# Patient Record
Sex: Female | Born: 1970 | Race: Black or African American | Hispanic: No | Marital: Single | Smoking: Never smoker
Health system: Southern US, Community
[De-identification: ages and names within clinical notes are randomized; demographics above are authoritative.]

---

## 2001-02-28 ENCOUNTER — Emergency Department (HOSPITAL_COMMUNITY): Admission: EM | Admit: 2001-02-28 | Discharge: 2001-02-28 | Payer: Self-pay | Admitting: Emergency Medicine

## 2001-07-05 ENCOUNTER — Encounter: Payer: Self-pay | Admitting: Emergency Medicine

## 2001-07-05 ENCOUNTER — Emergency Department (HOSPITAL_COMMUNITY): Admission: EM | Admit: 2001-07-05 | Discharge: 2001-07-05 | Payer: Self-pay

## 2001-07-07 ENCOUNTER — Inpatient Hospital Stay (HOSPITAL_COMMUNITY): Admission: AD | Admit: 2001-07-07 | Discharge: 2001-07-07 | Payer: Self-pay | Admitting: Obstetrics

## 2001-07-13 ENCOUNTER — Encounter: Payer: Self-pay | Admitting: Obstetrics

## 2001-07-13 ENCOUNTER — Inpatient Hospital Stay (HOSPITAL_COMMUNITY): Admission: RE | Admit: 2001-07-13 | Discharge: 2001-07-13 | Payer: Self-pay | Admitting: Obstetrics

## 2001-08-02 ENCOUNTER — Inpatient Hospital Stay (HOSPITAL_COMMUNITY): Admission: AD | Admit: 2001-08-02 | Discharge: 2001-08-02 | Payer: Self-pay | Admitting: *Deleted

## 2001-10-28 ENCOUNTER — Encounter: Payer: Self-pay | Admitting: Obstetrics and Gynecology

## 2001-10-28 ENCOUNTER — Inpatient Hospital Stay (HOSPITAL_COMMUNITY): Admission: AD | Admit: 2001-10-28 | Discharge: 2001-10-28 | Payer: Self-pay | Admitting: Obstetrics and Gynecology

## 2001-11-29 ENCOUNTER — Inpatient Hospital Stay (HOSPITAL_COMMUNITY): Admission: AD | Admit: 2001-11-29 | Discharge: 2001-11-29 | Payer: Self-pay | Admitting: *Deleted

## 2001-11-29 ENCOUNTER — Encounter (INDEPENDENT_AMBULATORY_CARE_PROVIDER_SITE_OTHER): Payer: Self-pay

## 2001-12-06 ENCOUNTER — Inpatient Hospital Stay (HOSPITAL_COMMUNITY): Admission: AD | Admit: 2001-12-06 | Discharge: 2001-12-06 | Payer: Self-pay | Admitting: *Deleted

## 2002-01-08 ENCOUNTER — Inpatient Hospital Stay (HOSPITAL_COMMUNITY): Admission: AD | Admit: 2002-01-08 | Discharge: 2002-01-08 | Payer: Self-pay | Admitting: *Deleted

## 2002-01-22 ENCOUNTER — Inpatient Hospital Stay (HOSPITAL_COMMUNITY): Admission: AD | Admit: 2002-01-22 | Discharge: 2002-01-22 | Payer: Self-pay | Admitting: *Deleted

## 2002-01-26 ENCOUNTER — Inpatient Hospital Stay (HOSPITAL_COMMUNITY): Admission: AD | Admit: 2002-01-26 | Discharge: 2002-01-26 | Payer: Self-pay | Admitting: *Deleted

## 2002-02-21 ENCOUNTER — Inpatient Hospital Stay (HOSPITAL_COMMUNITY): Admission: AD | Admit: 2002-02-21 | Discharge: 2002-02-21 | Payer: Self-pay | Admitting: *Deleted

## 2002-02-24 ENCOUNTER — Inpatient Hospital Stay (HOSPITAL_COMMUNITY): Admission: AD | Admit: 2002-02-24 | Discharge: 2002-02-24 | Payer: Self-pay | Admitting: *Deleted

## 2002-02-28 ENCOUNTER — Inpatient Hospital Stay (HOSPITAL_COMMUNITY): Admission: AD | Admit: 2002-02-28 | Discharge: 2002-02-28 | Payer: Self-pay | Admitting: *Deleted

## 2002-03-05 ENCOUNTER — Inpatient Hospital Stay (HOSPITAL_COMMUNITY): Admission: AD | Admit: 2002-03-05 | Discharge: 2002-03-05 | Payer: Self-pay | Admitting: *Deleted

## 2002-03-07 ENCOUNTER — Inpatient Hospital Stay (HOSPITAL_COMMUNITY): Admission: AD | Admit: 2002-03-07 | Discharge: 2002-03-10 | Payer: Self-pay | Admitting: *Deleted

## 2002-12-29 ENCOUNTER — Other Ambulatory Visit: Admission: RE | Admit: 2002-12-29 | Discharge: 2002-12-29 | Payer: Self-pay | Admitting: *Deleted

## 2003-04-10 ENCOUNTER — Ambulatory Visit (HOSPITAL_COMMUNITY): Admission: RE | Admit: 2003-04-10 | Discharge: 2003-04-10 | Payer: Self-pay | Admitting: *Deleted

## 2003-04-10 ENCOUNTER — Encounter: Payer: Self-pay | Admitting: *Deleted

## 2003-04-24 ENCOUNTER — Inpatient Hospital Stay (HOSPITAL_COMMUNITY): Admission: AD | Admit: 2003-04-24 | Discharge: 2003-04-25 | Payer: Self-pay | Admitting: *Deleted

## 2003-06-06 ENCOUNTER — Inpatient Hospital Stay (HOSPITAL_COMMUNITY): Admission: AD | Admit: 2003-06-06 | Discharge: 2003-06-07 | Payer: Self-pay | Admitting: *Deleted

## 2003-07-03 ENCOUNTER — Inpatient Hospital Stay (HOSPITAL_COMMUNITY): Admission: AD | Admit: 2003-07-03 | Discharge: 2003-07-04 | Payer: Self-pay | Admitting: Obstetrics and Gynecology

## 2003-07-17 ENCOUNTER — Inpatient Hospital Stay (HOSPITAL_COMMUNITY): Admission: AD | Admit: 2003-07-17 | Discharge: 2003-07-19 | Payer: Self-pay | Admitting: Obstetrics and Gynecology

## 2003-08-20 ENCOUNTER — Other Ambulatory Visit: Admission: RE | Admit: 2003-08-20 | Discharge: 2003-08-20 | Payer: Self-pay | Admitting: Obstetrics and Gynecology

## 2003-09-25 ENCOUNTER — Ambulatory Visit (HOSPITAL_COMMUNITY): Admission: RE | Admit: 2003-09-25 | Discharge: 2003-09-25 | Payer: Self-pay | Admitting: Obstetrics and Gynecology

## 2003-09-25 ENCOUNTER — Encounter (INDEPENDENT_AMBULATORY_CARE_PROVIDER_SITE_OTHER): Payer: Self-pay | Admitting: Specialist

## 2003-12-28 ENCOUNTER — Other Ambulatory Visit: Admission: RE | Admit: 2003-12-28 | Discharge: 2003-12-28 | Payer: Self-pay | Admitting: Obstetrics and Gynecology

## 2005-03-16 ENCOUNTER — Other Ambulatory Visit: Admission: RE | Admit: 2005-03-16 | Discharge: 2005-03-16 | Payer: Self-pay | Admitting: Obstetrics and Gynecology

## 2005-04-20 ENCOUNTER — Ambulatory Visit: Payer: Self-pay | Admitting: Family Medicine

## 2005-06-03 ENCOUNTER — Ambulatory Visit: Payer: Self-pay | Admitting: Family Medicine

## 2005-11-27 ENCOUNTER — Ambulatory Visit: Payer: Self-pay | Admitting: Family Medicine

## 2009-11-27 ENCOUNTER — Encounter: Payer: Self-pay | Admitting: Internal Medicine

## 2010-03-20 ENCOUNTER — Encounter: Payer: Self-pay | Admitting: Internal Medicine

## 2010-04-15 ENCOUNTER — Encounter: Payer: Self-pay | Admitting: Emergency Medicine

## 2010-04-16 DIAGNOSIS — J189 Pneumonia, unspecified organism: Secondary | ICD-10-CM

## 2010-04-17 ENCOUNTER — Encounter (INDEPENDENT_AMBULATORY_CARE_PROVIDER_SITE_OTHER): Payer: Self-pay | Admitting: *Deleted

## 2010-04-17 ENCOUNTER — Ambulatory Visit: Payer: Self-pay | Admitting: Internal Medicine

## 2010-04-17 LAB — CONVERTED CEMR LAB
CD4 T Helper %: 1 %
HIV 1 RNA Quant: 654000 copies/mL

## 2010-04-21 ENCOUNTER — Ambulatory Visit: Payer: Self-pay | Admitting: Vascular Surgery

## 2010-04-21 ENCOUNTER — Encounter: Payer: Self-pay | Admitting: Infectious Diseases

## 2010-04-24 ENCOUNTER — Encounter (INDEPENDENT_AMBULATORY_CARE_PROVIDER_SITE_OTHER): Payer: Self-pay | Admitting: *Deleted

## 2010-04-24 LAB — CONVERTED CEMR LAB
Glucose, Bld: 105 mg/dL
Hemoglobin: 1102 g/dL

## 2010-04-25 DIAGNOSIS — D638 Anemia in other chronic diseases classified elsewhere: Secondary | ICD-10-CM | POA: Insufficient documentation

## 2010-04-29 ENCOUNTER — Encounter (INDEPENDENT_AMBULATORY_CARE_PROVIDER_SITE_OTHER): Payer: Self-pay | Admitting: *Deleted

## 2010-05-01 ENCOUNTER — Ambulatory Visit: Payer: Self-pay | Admitting: Internal Medicine

## 2010-05-01 DIAGNOSIS — Z888 Allergy status to other drugs, medicaments and biological substances status: Secondary | ICD-10-CM

## 2010-05-02 ENCOUNTER — Ambulatory Visit: Payer: Self-pay | Admitting: Internal Medicine

## 2010-05-02 ENCOUNTER — Inpatient Hospital Stay (HOSPITAL_COMMUNITY): Admission: EM | Admit: 2010-05-02 | Discharge: 2010-05-10 | Payer: Self-pay | Admitting: Emergency Medicine

## 2010-05-06 DIAGNOSIS — B20 Human immunodeficiency virus [HIV] disease: Secondary | ICD-10-CM

## 2010-05-06 DIAGNOSIS — A318 Other mycobacterial infections: Secondary | ICD-10-CM | POA: Insufficient documentation

## 2010-05-06 DIAGNOSIS — F29 Unspecified psychosis not due to a substance or known physiological condition: Secondary | ICD-10-CM | POA: Insufficient documentation

## 2010-05-14 ENCOUNTER — Encounter: Payer: Self-pay | Admitting: Internal Medicine

## 2010-05-21 ENCOUNTER — Ambulatory Visit: Payer: Self-pay | Admitting: Internal Medicine

## 2010-05-22 ENCOUNTER — Encounter (INDEPENDENT_AMBULATORY_CARE_PROVIDER_SITE_OTHER): Payer: Self-pay | Admitting: *Deleted

## 2010-05-22 ENCOUNTER — Telehealth (INDEPENDENT_AMBULATORY_CARE_PROVIDER_SITE_OTHER): Payer: Self-pay | Admitting: *Deleted

## 2010-05-27 ENCOUNTER — Ambulatory Visit: Payer: Self-pay | Admitting: Internal Medicine

## 2010-05-27 LAB — CONVERTED CEMR LAB
ALT: 19 units/L (ref 0–35)
AST: 29 units/L (ref 0–37)
Alkaline Phosphatase: 120 units/L — ABNORMAL HIGH (ref 39–117)
Basophils Relative: 3 % — ABNORMAL HIGH (ref 0–1)
Calcium: 9.2 mg/dL (ref 8.4–10.5)
Chlamydia, Swab/Urine, PCR: NEGATIVE
Chloride: 101 meq/L (ref 96–112)
Creatinine, Ser: 0.68 mg/dL (ref 0.40–1.20)
GC Probe Amp, Urine: NEGATIVE
HCT: 27.4 % — ABNORMAL LOW (ref 36.0–46.0)
Hep B S Ab: POSITIVE — AB
Hepatitis B Surface Ag: NEGATIVE
Lymphs Abs: 0.7 10*3/uL (ref 0.7–4.0)
Neutro Abs: 0.6 10*3/uL — ABNORMAL LOW (ref 1.7–7.7)
Neutrophils Relative %: 25 % — ABNORMAL LOW (ref 43–77)
Platelets: 262 10*3/uL (ref 150–400)
Potassium: 3.5 meq/L (ref 3.5–5.3)
RBC: 3.12 M/uL — ABNORMAL LOW (ref 3.87–5.11)
Total CHOL/HDL Ratio: 7.8
Total Protein: 7.4 g/dL (ref 6.0–8.3)
VLDL: 32 mg/dL (ref 0–40)
WBC: 2.3 10*3/uL — ABNORMAL LOW (ref 4.0–10.5)

## 2010-05-29 ENCOUNTER — Encounter (INDEPENDENT_AMBULATORY_CARE_PROVIDER_SITE_OTHER): Payer: Self-pay | Admitting: *Deleted

## 2010-06-02 ENCOUNTER — Telehealth: Payer: Self-pay | Admitting: Internal Medicine

## 2010-07-18 ENCOUNTER — Ambulatory Visit: Payer: Self-pay | Admitting: Psychiatry

## 2010-08-05 ENCOUNTER — Ambulatory Visit: Payer: Self-pay | Admitting: Internal Medicine

## 2010-08-05 LAB — CONVERTED CEMR LAB
ALT: 11 units/L (ref 0–35)
Alkaline Phosphatase: 93 units/L (ref 39–117)
Chloride: 106 meq/L (ref 96–112)
Creatinine, Ser: 0.78 mg/dL (ref 0.40–1.20)
Eosinophils Absolute: 0.3 10*3/uL (ref 0.0–0.7)
Eosinophils Relative: 13 % — ABNORMAL HIGH (ref 0–5)
Glucose, Bld: 72 mg/dL (ref 70–99)
HIV-1 RNA Quant, Log: <1.3 (ref <1.30)
Lymphocytes Relative: 25 % (ref 12–46)
Lymphs Abs: 0.6 10*3/uL — ABNORMAL LOW (ref 0.7–4.0)
MCV: 92 fL (ref 78.0–100.0)
Monocytes Absolute: 0.5 10*3/uL (ref 0.1–1.0)
Neutrophils Relative %: 38 % — ABNORMAL LOW (ref 43–77)
Platelets: 252 10*3/uL (ref 150–400)
Potassium: 4 meq/L (ref 3.5–5.3)
RDW: 15.9 % — ABNORMAL HIGH (ref 11.5–15.5)
Total Bilirubin: 0.8 mg/dL (ref 0.3–1.2)

## 2010-08-12 ENCOUNTER — Telehealth (INDEPENDENT_AMBULATORY_CARE_PROVIDER_SITE_OTHER): Payer: Self-pay | Admitting: *Deleted

## 2010-08-14 ENCOUNTER — Inpatient Hospital Stay (HOSPITAL_COMMUNITY): Admission: EM | Admit: 2010-08-14 | Discharge: 2010-04-25 | Payer: Self-pay | Admitting: Internal Medicine

## 2010-09-16 ENCOUNTER — Encounter: Payer: Self-pay | Admitting: Internal Medicine

## 2010-09-16 ENCOUNTER — Ambulatory Visit
Admission: RE | Admit: 2010-09-16 | Discharge: 2010-09-16 | Payer: Self-pay | Source: Home / Self Care | Attending: Internal Medicine | Admitting: Internal Medicine

## 2010-10-03 ENCOUNTER — Ambulatory Visit: Admit: 2010-10-03 | Payer: Self-pay | Admitting: Internal Medicine

## 2010-10-06 ENCOUNTER — Encounter (INDEPENDENT_AMBULATORY_CARE_PROVIDER_SITE_OTHER): Payer: Self-pay | Admitting: *Deleted

## 2010-10-09 NOTE — Assessment & Plan Note (Signed)
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  History of Present Illness: Michelle Flores is in for her hospital follow-up visit with her god mother, Mrs. Lorin Picket. She says that she is feeling much better.  She does not have her medications or her pill box with her says that she has not missed any of her medications.  She has been staying with Mrs. Scott since discharge.  Preventive Screening-Counseling & Management  Alcohol-Tobacco     Alcohol drinks/day: 0     Smoking Status: never  Caffeine-Diet-Exercise     Caffeine use/day: no     Does Patient Exercise: yes     Type of exercise: yoga     Exercise (avg: min/session): >60     Times/week: 2  Hep-HIV-STD-Contraception     HIV Risk: no risk noted  Safety-Violence-Falls     Seat Belt Use: yes  Comments: declined condoms      Sexual History:  currently monogamous.        Drug Use:  never.     Prior Medication List:  TRUVADA 200-300 MG TABS (EMTRICITABINE-TENOFOVIR) Take 1 tablet by mouth once a day ISENTRESS 400 MG TABS (RALTEGRAVIR POTASSIUM) Take 1 tablet by mouth two times a day FLUCONAZOLE 100 MG TABS (FLUCONAZOLE) Take one tablet by mouth on Fridays DAPSONE 100 MG TABS (DAPSONE) Take 1 tablet by mouth once a day AZITHROMYCIN 600 MG TABS (AZITHROMYCIN) Take 2 tablets by mouth on Thursdays. NYSTATIN 100000 UNIT/ML SUSP (NYSTATIN) Take 2 mL by mouth every 6 hours as needed NONI MORINDA CITRIFOLIA 400 MG CAPS (NONI (MORINDA CITRIFOLIA)) Take 1 tablet by mouth two times a day PROMETHAZINE HCL 6.25 MG/5ML SYRP (PROMETHAZINE HCL) Take 1 teaspoon by mouth every 6 hours as needed for nausea. FERROUS SULFATE 325 (65 FE) MG TABS (FERROUS SULFATE) Take 1 tablet by mouth two times a day   Current Allergies: ! SULFA ! BENADRYL ! 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 No Pain Assessment Patient in pain? no      Nutritional Status BMI of 19 -24 = normal Nutritional Status Detail appetite "good"  Have you ever been in a relationship where you felt threatened, hurt or afraid?not asked, god mother present   Does patient need assistance? Functional Status Self care Ambulation Normal Comments no missed doses of rxes since leaving the hosp. LMP (date): 03/28/2010     Menstrual Status regular Enter LMP: 03/28/2010   Physical Exam  General:  alert and well-nourished.   Mouth:  good dentition, pharynx pink and moist, no erythema, and no exudates.   Lungs:  normal breath sounds, no crackles, and no wheezes.  Heart:  normal rate, regular rhythm, and no murmur.   Skin:  no rashes.   Axillary Nodes:  no R axillary adenopathy and no L axillary adenopathy.   Psych:  good eye contact, not anxious appearing, and not depressed appearing.  She continues to talk about returning to [place] then returning here to start her [employer].        Medication Adherence: 05/01/2010   Adherence to medications reviewed with patient. Counseling to provide adequate adherence provided                                Impression & Recommendations:  Problem # 1:  HIV INFECTION (ICD-042) Alyse has not been able to adhere to any regimen for her HIV infection in the past.  She was counseled extensively by her previous  doctors at [facility] without success.  Although she tells me she is taking her medications I still have my doubts.  She is unwilling to enter counseling at this time.  I will see her back in one week and I have asked her and Mrs. Scott to return with all of her medications. The following medications were removed from the medication list:    Azithromycin 600 Mg Tabs (Azithromycin) .Marland Kitchen... Take 2 tablets by mouth on thursdays. Her updated medication list for this problem includes:    Fluconazole 100 Mg Tabs (Fluconazole) .Marland Kitchen... Take one tablet by mouth on fridays    Nystatin 100000 Unit/ml Susp (Nystatin) .Marland Kitchen... 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 Ideally, she should start on a 3 drug regimen but currently she is asymptomatic and I feel quite certain that additional medications at this time it would probably overwhelm her.  I will see her back in one week.  Other Orders: Influenza Vaccine NON MCR (04540) Est. Patient Level IV (98119)  Patient Instructions: 1)  Please schedule a follow-up appointment on Wednesday, 8/31 at 9:00 am.    Pneumovax Immunization History:    Pneumovax # 1:  Historical (02/17/2008)  Hepatitis A Immunization History:    Hep A # 1:  Historical (02/17/2008)  Influenza Vaccine    Vaccine Type: Fluvax Non-MCR    Site: left deltoid    Mfr: novartis    Dose: 0.5 ml    Route: IM    Given by: Jennet Maduro RN    Exp. Date: 12/07/2010    Lot #: [id number]  Flu Vaccine Consent Questions    Do you have a history of severe allergic reactions to this vaccine? no    Any prior history of allergic reactions to egg and/or gelatin? no    Do  you have a sensitivity to the preservative Thimersol? no    Do you have a past history of Guillan-Barre Syndrome? no    Do you currently have an acute febrile illness? no    Have you ever had a severe reaction to latex? no    Vaccine information given and explained to patient? yes    Are you currently pregnant? no

## 2010-10-09 NOTE — Assessment & Plan Note (Signed)
Summary: 6wk f/u [mkj]   CC:  follow-up visit.  History of Present Illness: Michelle Flores is in for a routine visit.  She states that she is doing very well with the exception of being concerned about her weight gain and a mild rash on her face.  She is not had any problems obtaining her medications and states that she has not missed a single dose since her last visit.  She is working with her law here to regain custody of her two daughters and has moved them back here to [place].  They are enrolled in a new school and she is considering returning to work.  Preventive Screening-Counseling & Management  Alcohol-Tobacco     Alcohol drinks/day: 0     Smoking Status: never  Caffeine-Diet-Exercise     Caffeine use/day: no     Does Patient Exercise: yes     Type of exercise: yoga     Exercise (avg: min/session): >60     Times/week: 2  Hep-HIV-STD-Contraception     HIV Risk: no risk noted     HIV Risk Counseling: not indicated-no HIV risk noted  Safety-Violence-Falls     Seat Belt Use: yes  Comments: declined condoms      Sexual History:  currently monogamous.        Drug Use:  never.     Current Allergies (reviewed today): ! SULFA ! BENADRYL ! * LATEX Vital Signs:  Patient profile:   40 year old female Menstrual status:  irregular Height:      66 inches (167.64 cm) Weight:      152.25 pounds (69.20 kg) BMI:     24.66 Temp:     98.2 degrees F (36.78 degrees C) oral Pulse rate:   81 / minute BP sitting:   113 / 70  (right arm) Cuff size:   regular  Vitals Entered By: Jennet Maduro RN (September 16, 2010 10:50 AM) CC: follow-up visit Is Patient Diabetic? No Pain Assessment Patient in pain? no      Nutritional Status BMI of 19 -24 = normal Nutritional Status Detail appetite "good"  Have you ever been in a relationship where you felt threatened, hurt or afraid?No   Does patient need assistance? Functional Status Self care Ambulation Normal Comments no missed doses  of rxes   Physical Exam  General:  alert and well-nourished.  she has gained weight. Mouth:  good dentition, pharynx pink and moist, no erythema, and no exudates.   Lungs:  normal breath sounds, no crackles, and no wheezes.   Heart:  normal rate, regular rhythm, and no murmur.   Skin:  there is a very fine a nonspecific papular rash on her cheeks. Psych:  normally interactive, good eye contact, not anxious appearing, and not depressed appearing.          Medication Adherence: 09/16/2010   Adherence to medications reviewed with patient. Counseling to provide adequate adherence provided   Prevention For Positives: 09/16/2010   Safe sex practices discussed with patient. Condoms offered.                             Impression & Recommendations:  Problem # 1:  HIV INFECTION (ICD-042) Michelle Flores has had remarkable improvement with complete suppression of her virus.  Her weight gain and mild rash of the results of the recovering immune system.  I will continue her current regimen. Her updated medication list for this problem includes:  Fluconazole 100 Mg Tabs (Fluconazole) .Marland Kitchen... Take one tablet by mouth on fridays    Azithromycin 500 Mg Tabs (Azithromycin) .Marland Kitchen... Take 1 tablet by mouth once a day    Ethambutol Hcl 400 Mg Tabs (Ethambutol hcl) .Marland Kitchen... 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 Patient Level III (99213)Future Orders: T-CD4SP ([facility]) (CD4SP) ... 12/15/2010 T-HIV Viral Load [id number]) ... 12/15/2010 T-Comprehensive Metabolic Panel [id number]) ... 12/15/2010 T-CBC w/Diff ([id number]) ... 12/15/2010 T-RPR (Syphilis) [id number]) ... 12/15/2010 T-Lipid Profile [id number]) ...  12/15/2010  Medications Added to Medication List This Visit: 1)  Mega Food Vitamin Supplement  .... Take 1 capsule by mouth once a day  Patient Instructions: 1)  Please schedule a follow-up appointment in 3 months. 2)  Please schedule an appointment fo the Pap clinic.    PPD Skin Test (to be given today)  Appended Document: 6wk f/u + PPD   PPD Application    Vaccine Type: PPD    Site: left forearm    Mfr: Sanofi Pasteur    Dose: 0.1 ml    Route: ID    Given by: Jennet Maduro RN    Exp. Date: 03/13/2012    Lot #: [id number]

## 2010-10-09 NOTE — Consult Note (Signed)
Summary: Alesia Banda ID  [facility] ID   Imported By: Florinda Marker 05/14/2010 11:56:23  _____________________________________________________________________  External Attachment:    Type:   Image     Comment:   External Document

## 2010-10-09 NOTE — Assessment & Plan Note (Signed)
Summary: HFU/VS   CC:  follow-up visit and O2 sat while walking dropped to 85% w/ HR of 124.  History of Present Illness: Michelle Flores is in for her hospital follow-up visit after her two recent hospitalizations with probable PCP and Mycobacterium avium bacteremia.  Since discharge she has been living with her sister and children in [place].  She took a train impact appeared today and plans to return by train tonight.  She says she is feeling better and has minimal shortness of breath.  She is unaware of any recent fevers.  She does not have her medications or pill box with her.  She says that she has been using her pill box.  She tells me that she has been taking her Truvada, Isentress, fluconazole, primaquine, prednisone and iron.   She tells me that when she went to the [facility] pharmacy on [address] in [place] after her last discharge, the pharmacist told her that clindamycin, ethambutol, and clarithromycin were no longer available.  She has not been taking any of those medications since discharge.  She says that she is planning to return to [place] on September 22 to get some rest.  She hopes to stay there at least one month.  Preventive Screening-Counseling & Management  Alcohol-Tobacco     Alcohol drinks/day: 0     Smoking Status: never  Caffeine-Diet-Exercise     Caffeine use/day: no     Does Patient Exercise: yes     Type of exercise: yoga     Exercise (avg: min/session): >60     Times/week: 2  Hep-HIV-STD-Contraception     HIV Risk: no risk noted  Safety-Violence-Falls     Seat Belt Use: yes      Sexual History:  engaged.        Drug Use:  never.     Updated Prior Medication List: TRUVADA 200-300 MG TABS (EMTRICITABINE-TENOFOVIR) Take 1 tablet by mouth once a day ISENTRESS 400 MG TABS (RALTEGRAVIR POTASSIUM) Take 1 tablet by mouth two times a day PRIMAQUINE PHOSPHATE 26.3 MG TABS (PRIMAQUINE PHOSPHATE)  PREDNISONE 10 MG TABS (PREDNISONE)  FLUCONAZOLE 100 MG TABS  (FLUCONAZOLE) Take one tablet by mouth on Fridays NONI MORINDA CITRIFOLIA 400 MG CAPS (NONI (MORINDA CITRIFOLIA)) Take 1 tablet by mouth two times a day PROMETHAZINE HCL 6.25 MG/5ML SYRP (PROMETHAZINE HCL) Take 1 teaspoon by mouth every 6 hours as needed for nausea. FERROUS SULFATE 325 (65 FE) MG TABS (FERROUS SULFATE) Take 1 tablet by mouth two times a day  Current Allergies (reviewed today): ! SULFA ! BENADRYL ! * LATEX Social History: Sexual History:  engaged  Vital Signs:  Patient profile:   40 year old female Menstrual status:  regular Height:      66 inches (167.64 cm) Weight:      130.5 pounds (59.32 kg) BMI:     21.14 O2 Sat:      92 % on Room air Temp:     99.2 degrees F (37.33 degrees C) oral Pulse rate:   114 / minute BP sitting:   105 / 67  (right arm) Cuff size:   regular  Vitals Entered By: Jennet Maduro RN (May 21, 2010 4:04 PM)  O2 Flow:  Room air  Serial Vital Signs/Assessments:                                PEF    PreRx  PostRx Time  O2 Sat  O2 Type     L/min  L/min  L/min   By 4:32 PM   85  %   Room air                          Jennet Maduro RN  Comments: 4:32 PM O2 sat dropped to 85% while walking, pulse up to 124 By: Jennet Maduro RN   CC: follow-up visit, O2 sat while walking dropped to 85% w/ HR of 124 Is Patient Diabetic? No Pain Assessment Patient in pain? no      Nutritional Status BMI of 19 -24 = normal Nutritional Status Detail appetite "very good"  Have you ever been in a relationship where you felt threatened, hurt or afraid?No   Does patient need assistance? Functional Status Self care Ambulation Normal Comments clindamycin and other ABX from hospital, unable to obtain refills due to unable to reach Hospitalist.  No missed doses of HIV rxes.        Medication Adherence: 05/21/2010   Adherence to medications reviewed with patient. Counseling to provide adequate adherence provided   Prevention For Positives:  05/21/2010   Safe sex practices discussed with patient. Condoms offered.                              Physical Exam  General:  alert and well-nourished.  she has gained 10 pounds. Mouth:  good dentition, pharynx pink and moist, no erythema, and no exudates.   Lungs:  normal breath sounds, no crackles, and no wheezes.  her O2 sat subdued dropped into the high 80s with ambulation on room air.  She is not symptomatic with this however. Heart:  normal rate, regular rhythm, and no murmur.   Skin:  no rashes.   Psych:  normally interactive, good eye contact, not anxious appearing, and not depressed appearing.  she is in good spirits.   Impression & Recommendations:  Problem # 1:  HIV INFECTION (ICD-042) It sounds like she is taking her HIV medications but I need to get her back before she leaves for gone out to repeat her lab work and review her pillbox. The following medications were removed from the medication list:    Nystatin 100000 Unit/ml Susp (Nystatin) .Marland Kitchen... Take 2 ml by mouth every 6 hours as needed Her updated medication list for this problem includes:    Fluconazole 100 Mg Tabs (Fluconazole) .Marland Kitchen... Take one tablet by mouth on fridays  Diagnostics Reviewed:  CD4: <10 (04/17/2010)   WBC: 4.6 (04/24/2010)   Hgb: 1102 (04/24/2010)   Platelets: 386 (04/24/2010) HIV genotype: done (04/17/2010)   HIV-1 RNA: 654,000 (04/17/2010)     Orders: Est. Patient Level IV (16109)  Problem # 2:  PNEUMONIA, BILATERAL (ICD-486) We will call her pharmacy and try to figure out what the problem is with getting her onto her clindamycin to supplement the Primaquine and the prednisone needed to complete her therapy for presumed PCP.  Before she travels to [place] I will change her to prophylactic dapsone. Orders: Est. 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 Orders: Est. Patient Level IV (09811)  Medications Added to Medication List This Visit: 1)  Primaquine Phosphate 26.3 Mg Tabs (Primaquine phosphate) 2)  Prednisone 10 Mg  Tabs (Prednisone)  Patient Instructions: 1)  Please schedule a follow-up appointment on 9/20 at 3:00 pm.

## 2010-10-09 NOTE — Assessment & Plan Note (Signed)
Summary: f/u appt /dde   CC:  substernal pain, especially after eatting in the evening.  Feels like "gas" pains.   Cough has become productive after starting the antibiotics on Saturday, Sept. 17th.   "stopped up ears, fullness"  Sleeping is fine, drinking a lot of water because of the antibiotics.  Still fatigued during the day time, and does rest..  History of Present Illness: Michelle Flores is in for her routine visit. She  drove up from [place] today with a cousin.  She has all of her medications and her pill box with her today.  I went over all of her medications.  Unfortunately, she did not put her provide in her pillbox so it looks like she has not been taking that recently.  All other medications appear to be taken correctly. She was able to get clindamycin and her ethambutol after her last visit but the clarithromycin was on back order and her pharmacy did not inform us.  Overall, she is feeling better.  She thinks she may have had some fever last week but none since and she is not having any cough or shortness of breath.  Preventive Screening-Counseling & Management  Alcohol-Tobacco     Alcohol drinks/day: 0     Smoking Status: never  Caffeine-Diet-Exercise     Caffeine use/day: no     Does Patient Exercise: yes     Type of exercise: yoga     Exercise (avg: min/session): >60     Times/week: 2  Hep-HIV-STD-Contraception     HIV Risk: no risk noted  Safety-Violence-Falls     Seat Belt Use: yes      Sexual History:  fiance.        Drug Use:  never.     Updated Prior Medication List: TRUVADA 200-300 MG TABS (EMTRICITABINE-TENOFOVIR) Take 1 tablet by mouth once a day ISENTRESS 400 MG TABS (RALTEGRAVIR POTASSIUM) Take 1 tablet by mouth two times a day PRIMAQUINE PHOSPHATE 26.3 MG TABS (PRIMAQUINE PHOSPHATE) Take 2 tablets by mouth once a day PREDNISONE 10 MG TABS (PREDNISONE) taper as directed by Dr. Robb Matar FLUCONAZOLE 100 MG TABS (FLUCONAZOLE) Take one tablet by mouth on  Fridays NONI MORINDA CITRIFOLIA 400 MG CAPS (NONI (MORINDA CITRIFOLIA)) Take 1 tablet by mouth two times a day PROMETHAZINE HCL 6.25 MG/5ML SYRP (PROMETHAZINE HCL) Take 1 teaspoon by mouth every 6 hours as needed for nausea. FERROUS SULFATE 325 (65 FE) MG TABS (FERROUS SULFATE) Take 1 tablet by mouth two times a day CLARITHROMYCIN 500 MG TABS (CLARITHROMYCIN) Take 1 tablet by mouth two times a day* CLINDAMYCIN HCL 300 MG CAPS (CLINDAMYCIN HCL) Take 2 capsules by mouth every 6 hours ETHAMBUTOL HCL 400 MG TABS (ETHAMBUTOL HCL) Take 2 tablets by mouth once a day   * she does not have clarithromycin Current Allergies (reviewed today): ! SULFA ! BENADRYL ! * LATEX Social History: Sexual History:  fiance  Vital Signs:  Patient profile:   40 year old female Menstrual status:  regular Height:      66 inches (167.64 cm) Weight:      128.5 pounds (58.41 kg) BMI:     20.82 Temp:     98.6 degrees F (37.00 degrees C) oral Pulse rate:   112 / minute BP sitting:   106 / 67  (left arm) Cuff size:   regular  Vitals Entered By: Jennet Maduro RN (May 27, 2010 3:07 PM) CC: substernal pain, especially after eatting in the evening.  Feels like "gas" pains.  Cough has become productive after starting the antibiotics on Saturday, Sept. 17th.   "stopped up ears, fullness"  Sleeping is fine, drinking a lot of water because of the antibiotics.  Still fatigued during the day time, does rest. Is Patient Diabetic? No Pain Assessment Patient in pain? no      Nutritional Status BMI of 19 -24 = normal Nutritional Status Detail appetita "starting to improve"  Have you ever been in a relationship where you felt threatened, hurt or afraid?No   Does patient need assistance? Functional Status Self care Ambulation Normal   Physical Exam  General:  alert and well-nourished.  cheerful. Mouth:  good dentition, pharynx pink and moist, no erythema, and no exudates.   Lungs:  normal breath sounds, no  crackles, and no wheezes.   Heart:  normal rate, regular rhythm, and no murmur.   Skin:  no rashes.   Psych:  normally interactive, good eye contact, not anxious appearing, and not depressed appearing.  she is in good spirits.        Medication Adherence: 05/27/2010   Adherence to medications reviewed with patient. Counseling to provide adequate adherence provided                                Impression & Recommendations:  Problem # 1:  HIV INFECTION (ICD-042) I have filled out her pill box correctly and emphasized the need to put top priority on the Truvada and Isentress.  I have simplified her medication list. The good news is, she does appear to be willing to take her anti-retroviral medications.  I will check lab work today and see her back in one month. The following medications were removed from the medication list:    Clindamycin Hcl 300 Mg Caps (Clindamycin hcl) .Marland Kitchen... Take 2 capsules by mouth every 6 hours Her updated medication list for this problem includes:    Fluconazole 100 Mg Tabs (Fluconazole) .Marland Kitchen... Take one tablet by mouth on fridays    Azithromycin 500 Mg Tabs (Azithromycin) .Marland Kitchen... Take 1 tablet by mouth once a day    Ethambutol Hcl 400 Mg Tabs (Ethambutol hcl) .Marland Kitchen... Take 2 tablets by mouth once a day  Diagnostics Reviewed:  HIV: CDC-defined AIDS (05/21/2010)   CD4: <10 (04/17/2010)   WBC: 4.6 (04/24/2010)   Hgb: 1102 (04/24/2010)   Platelets: 386 (04/24/2010) HIV genotype: done (04/17/2010)   HIV-1 RNA: 654,000 (04/17/2010)     Orders: T-CD4SP ([facility]) (CD4SP) T-HIV Viral Load [id number]) T-Comprehensive Metabolic Panel [id number]) T-CBC w/Diff ([id number]) T-Lipid Profile ([id number]) T-RPR (Syphilis) (44010-27253) Est. Patient Level IV (66440) T-Chlamydia  Probe, urine ([id number]) T-GC Probe, urine ([id number]) T-Hepatitis C Antibody ([id number]) T-Hepatitis B Surface Antigen ([id number]) T-Hepatitis B Surface Antibody  ([id number])  Problem # 2:  PNEUMONIA, BILATERAL (ICD-486) Her pneumonia is resolved.  I will switch her to dapsone prophylaxis. The following medications were removed from the medication list:    Clindamycin Hcl 300 Mg Caps (Clindamycin hcl) .Marland Kitchen... Take 2 capsules by mouth every 6 hours Her updated medication list for this problem includes:    Azithromycin 500 Mg Tabs (Azithromycin) .Marland Kitchen... Take 1 tablet by mouth once a day    Ethambutol Hcl 400 Mg Tabs (Ethambutol hcl) .Marland Kitchen... Take 2 tablets by mouth once a day  Orders: Est. 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 Orders:  Est. Patient Level IV (04540)  Medications Added to Medication List This Visit: 1)  Dapsone 100 Mg Tabs (Dapsone) .... Take 1 tablet by mouth once a day 2)  Azithromycin 500 Mg Tabs (Azithromycin) .... Take 1 tablet by mouth once a day  Patient Instructions: 1)  Please schedule a follow-up appointment in 1 month.

## 2010-10-09 NOTE — Progress Notes (Signed)
Summary: Medications clarified with [facility], pt. to pickup 05/23/10  Phone Note Outgoing Call   Call placed by: Jennet Maduro RN,  May 22, 2010 3:32 PM Call placed to: [facility] Action Taken: [employer] Completed Summary of Call: Phone call to clarify hosp. discharge medications and pt. obtaining those rxes.  [facility] will have clarithromycin 500 mg, clindamycin 600mg , and ethambutol 800mg  for the pt. to pick tomorrow.  [facility] will call the pt when the rxes are ready for pickup.  Prednisone taper and primaquine 52.6 mg the pt. started right after discharge from [facility].  Jennet Maduro RN  May 22, 2010 3:36 PM

## 2010-10-09 NOTE — Miscellaneous (Signed)
Summary: [facility]   [facility]   Imported By: Florinda Marker 05/05/2010 15:33:35  _____________________________________________________________________  External Attachment:    Type:   Image     Comment:   External Document

## 2010-10-09 NOTE — Miscellaneous (Signed)
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 Added new medication of NYSTATIN 100000 UNIT/ML SUSP (NYSTATIN) Take 2 mL by mouth every 6 hours as needed Added new medication of NONI MORINDA CITRIFOLIA 400 MG CAPS (NONI (MORINDA CITRIFOLIA)) Take 1 tablet by mouth two times a day Added new medication of PROMETHAZINE HCL 6.25 MG/5ML SYRP (PROMETHAZINE HCL) Take 1 teaspoon by mouth every 6 hours as needed for nausea. Added new medication of FERROUS SULFATE 325 (65 FE) MG TABS (FERROUS SULFATE) Take 1 tablet by mouth two times a day Allergies: Added new allergy or adverse reaction of SULFA Observations: Added new observation of NKA: F (04/29/2010 16:35) Added new observation of CREATININE: 0.55 mg/dL (16/06/9603 54:09) Added new observation of PLATELETK/UL: 386 K/uL (04/24/2010 10:08) Added new observation of WBC COUNT: 4.6 10*3/microliter (04/24/2010 10:08) Added new observation of HGB: 1102 g/dL (81/19/1478 29:56) Added new observation of GLUCOSE SER: 105 mg/dL (21/30/8657 84:69) Added new observation of RPR: non-reactive  (04/17/2010 10:09) Added new observation of HIV GENOTYPE: done  (04/17/2010 10:09) Added new observation of HIV1RNA QA: 654,000 copies/mL (04/17/2010 10:09) Added new  observation of T-HELPER %: 1 % (04/17/2010 10:09) Added new observation of CD4 COUNT: <10 microliters (04/17/2010 10:09)      -  Date:  04/24/2010    Glucose: 105    Hemoglobin: 1102    WBC: 4.6    Platelets: 386    Creatinine: 0.55  Date:  04/17/2010    CD4: <10    CD4%: 1    Viral Load: 654,000    Genotype: done    RPR: non-reactive

## 2010-10-09 NOTE — Miscellaneous (Signed)
Summary: Medications updated  Clinical Lists Changes  Medications: Added new medication of CLARITHROMYCIN 500 MG TABS (CLARITHROMYCIN) Take 1 tablet by mouth two times a day Added new medication of CLINDAMYCIN HCL 300 MG CAPS (CLINDAMYCIN HCL) Take 2 capsules by mouth every 6 hours Added new medication of ETHAMBUTOL HCL 400 MG TABS (ETHAMBUTOL HCL) Take 2 tablets by mouth once a day Changed medication from PRIMAQUINE PHOSPHATE 26.3 MG TABS (PRIMAQUINE PHOSPHATE) to PRIMAQUINE PHOSPHATE 26.3 MG TABS (PRIMAQUINE PHOSPHATE) Take 2 tablets by mouth once a day Changed medication from PREDNISONE 10 MG TABS (PREDNISONE) to PREDNISONE 10 MG TABS (PREDNISONE) taper as directed by Dr. Robb Matar

## 2010-10-09 NOTE — Progress Notes (Signed)
Summary: Stomach burning, pt questions whether it is "reflux"  Phone Note Call from Patient Call back at Home Phone [phone]   Caller: Patient Call For: Cliffton Asters MD Reason for Call: Acute Illness, Talk to Doctor Summary of Call: Pt. having problem with stomach burning,  "reflux."  Was taking Protonix in the hospital and it helped.  She tried OTC Gax-X without relief.  Wanting to know what she should take.  Received the rest of her medications on Friday, Sept. 23 and started them that night.  She feels like she is "getting another cold."  She was coughing while on the phone.  She c/o being very tired and slept all weekend.   Please advise.  Jennet Maduro RN  June 02, 2010 4:02 PM   Follow-up for Phone Call        Please add her on at 3:45 tomorrow, 9/27. Follow-up by: Cliffton Asters MD,  June 02, 2010 4:34 PM

## 2010-10-09 NOTE — Consult Note (Signed)
Summary: [facility] Dept.: Physicial Exam 08/24/07  [facility] Dept.: Physicial Exam 08/24/07   Imported By: Florinda Marker 05/14/2010 11:55:38  _____________________________________________________________________  External Attachment:    Type:   Image     Comment:   External Document

## 2010-10-09 NOTE — Progress Notes (Signed)
Summary: lab results, Dr. to go over at visit  Phone Note Call from Patient   Caller: Patient Summary of Call: pt. called for lab results, explained that Dr. Orvan Falconer would go over results at January visit. Initial call taken by: Wendall Mola CMA Duncan Dull),  August 12, 2010 9:36 AM

## 2010-10-09 NOTE — Letter (Signed)
Summary: Out of Work  [facility]  [address]   [place], [place] [address]   Phone: [phone]  Fax: [phone]    September 16, 2010   Employee:  Michelle Flores    To Whom It May Concern:   For Medical reasons, please excuse the above named employee from work for the following dates:  Start:    End:    If you need additional information, please feel free to contact our office.         Sincerely,    Cliffton Asters MD

## 2010-10-09 NOTE — Consult Note (Signed)
Summary: [facility]  [facility]   Imported By: Florinda Marker 05/02/2010 15:21:03  _____________________________________________________________________  External Attachment:    Type:   Image     Comment:   External Document

## 2010-10-09 NOTE — Letter (Signed)
Summary: Out of Work  [facility]  [address]   [place], [place] [address]   Phone: [phone]  Fax: [phone]    September 16, 2010   Employee:  LYAN HOLCK Kolle    To Whom It May Concern:  Ms. Michelle Flores was hospitalized last year with two episodes of severe pneumonia.  She has now been taking her medications faithfully and has recovered completely and is free to return to work full time.  If you need additional information, please feel free to contact our office.         Sincerely,    Cliffton Asters MD

## 2010-10-09 NOTE — Miscellaneous (Signed)
Summary: HIPAA Restrictions  HIPAA Restrictions   Imported By: Florinda Marker 05/01/2010 14:51:24  _____________________________________________________________________  External Attachment:    Type:   Image     Comment:   External Document

## 2010-10-09 NOTE — Assessment & Plan Note (Signed)
Summary: 46month f/u/vs   CC:  starting 1-2 weeks ago she noticed fluid retention in her lower extremities, thighs, lower legs, and ankles and feet.  No other changes.  Has had some extra sodium of recently but not noticeably too much..  History of Present Illness: Michelle Flores is in for her routine visit.  She states that she is feeling much better her but she is worried about weight gain and water retention.  She continues to use her pill box and hasn't with her today.  She states that she has had no problems obtaining her medications and she does not believe she has missed a single dose.  Her pill box is filled correctly.  She continues to live with her cousin ( who she refers to as her sister) in [place] along with her two children.  While she was sick and in the hospital several months ago custody of her children was transferred to this cousin and Delma is currently trying to have that legally reversed.  As a result of these difficulties she has not traveled abroad since her last visit.  Preventive Screening-Counseling & Management  Alcohol-Tobacco     Alcohol drinks/day: 0     Smoking Status: never  Caffeine-Diet-Exercise     Caffeine use/day: no     Does Patient Exercise: yes     Type of exercise: yoga     Exercise (avg: min/session): >60     Times/week: 2  Hep-HIV-STD-Contraception     HIV Risk: no risk noted     HIV Risk Counseling: not indicated-no HIV risk noted  Safety-Violence-Falls     Seat Belt Use: yes  Comments: declined condoms      Sexual History:  currently monogamous.        Drug Use:  never.     Prior Medication List:  TRUVADA 200-300 MG TABS (EMTRICITABINE-TENOFOVIR) Take 1 tablet by mouth once a day ISENTRESS 400 MG TABS (RALTEGRAVIR POTASSIUM) Take 1 tablet by mouth two times a day DAPSONE 100 MG TABS (DAPSONE) Take 1 tablet by mouth once a day FLUCONAZOLE 100 MG TABS (FLUCONAZOLE) Take one tablet by mouth on Fridays NONI MORINDA CITRIFOLIA 400 MG CAPS  (NONI (MORINDA CITRIFOLIA)) Take 1 tablet by mouth two times a day PROMETHAZINE HCL 6.25 MG/5ML SYRP (PROMETHAZINE HCL) Take 1 teaspoon by mouth every 6 hours as needed for nausea. AZITHROMYCIN 500 MG TABS (AZITHROMYCIN) Take 1 tablet by mouth once a day ETHAMBUTOL HCL 400 MG TABS (ETHAMBUTOL HCL) Take 2 tablets by mouth once a day   Current Allergies (reviewed today): ! SULFA ! BENADRYL ! * LATEX Social History: Sexual History:  currently monogamous  Additional History Menstrual Status:  irregular  Vital Signs:  Patient profile:   40 year old female Menstrual status:  irregular LMP:     05/05/2010 Height:      66 inches (167.64 cm) Weight:      148.75 pounds (67.61 kg) Temp:     97.8 degrees F (36.56 degrees C) oral Pulse rate:   83 / minute BP sitting:   106 / 67  (left arm) Cuff size:   regular  Vitals Entered By: Jennet Maduro RN (August 05, 2010 10:53 AM) CC: starting 1-2 weeks ago she noticed fluid retention in her lower extremities, thighs, lower legs, ankles and feet.  No other changes.  Has had some extra sodium of recently but not noticeably too much. Is Patient Diabetic? No Pain Assessment Patient in pain? no      Nutritional  Status BMI of 19 -24 = normal Nutritional Status Detail appetite "good"  Have you ever been in a relationship where you felt threatened, hurt or afraid?No   Does patient need assistance? Functional Status Self care Ambulation Normal Comments no missed doses of HIV rxes LMP (date): 05/05/2010     Menstrual Status irregular Enter LMP: 05/05/2010   Physical Exam  General:  alert and well-nourished.  she has gained about 20 pounds. Mouth:  good dentition, pharynx pink and moist, no erythema, and no exudates.   Lungs:  normal breath sounds, no crackles, and no wheezes.   Heart:  normal rate, regular rhythm, and no murmur.   Abdomen:  soft, non-tender, normal bowel sounds, and no masses.   Extremities:  trace left pedal edema  and trace right pedal edema.   Skin:  no rashes.   Inguinal Nodes:  no R inguinal adenopathy and no L inguinal adenopathy.   Psych:  normally interactive, good eye contact, not anxious appearing, and not depressed appearing.          Medication Adherence: 08/05/2010   Adherence to medications reviewed with patient. Counseling to provide adequate adherence provided                                Impression & Recommendations:  Problem # 1:  HIV INFECTION (ICD-042) patella has improved dramatically over the last several months and it appears that she has been adhering to her medications correctly.  Her blood work done in September showed marked improvement with reduction in her viral load from nearly 650,000 to 89. 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 I will continue her current regimen and repeat blood work today. Her updated medication list for this problem includes:    Fluconazole 100 Mg Tabs (Fluconazole) .Marland Kitchen... Take one tablet by mouth on fridays    Azithromycin 500 Mg Tabs (Azithromycin) .Marland Kitchen... Take 1 tablet by mouth once a day    Ethambutol Hcl 400 Mg Tabs (Ethambutol hcl) .Marland Kitchen... Take 2 tablets by mouth once a day  Diagnostics Reviewed:  HIV: CDC-defined AIDS (05/21/2010)   CD4: 60 (05/28/2010)   WBC: 2.3 (05/27/2010)   Hgb: 9.1 (05/27/2010)   HCT: 27.4 (05/27/2010)   Platelets: 262 (05/27/2010) HIV genotype: done (04/17/2010)   HIV-1 RNA: 89 (05/27/2010)   HBSAg: NEG (05/27/2010)  Orders: T-CD4SP ([facility]) (CD4SP) T-HIV Viral Load ([address]) T-Comprehensive Metabolic Panel ([address]) T-CBC w/Diff ([address]) Est. Patient Level IV (57846)  Patient Instructions: 1)  Please schedule a follow-up appointment in 6 weeks. 2)  Please schedule for Pap smear clinic.         Medication Adherence: 08/05/2010   Adherence to medications reviewed with patient. Counseling to provide adequate  adherence provided

## 2010-10-09 NOTE — Miscellaneous (Signed)
Summary: RW update  Clinical Lists Changes  Observations: Added new observation of RWPARTICIP: Yes (05/29/2010 9:13)

## 2010-10-15 ENCOUNTER — Encounter (INDEPENDENT_AMBULATORY_CARE_PROVIDER_SITE_OTHER): Payer: Self-pay | Admitting: *Deleted

## 2010-10-15 NOTE — Miscellaneous (Signed)
  Clinical Lists Changes  Observations: Added new observation of INCOMESOURCE: UNKNOWN (10/06/2010 15:47) Added new observation of HOUSEINCOME: 0  (10/06/2010 15:47) Added new observation of #CHILD<18 IN: Yes  (10/06/2010 15:47) Added new observation of FAMILYSIZE: 1  (10/06/2010 15:47) Added new observation of HOUSING: Unknown  (10/06/2010 15:47) Added new observation of YEARLYEXPEN: 0  (10/06/2010 15:47) Added new observation of GENDER: Female  (10/06/2010 15:47) Added new observation of MARITAL STAT: Divorced  (10/06/2010 15:47) Added new observation of LATINO/HISP: No  (10/06/2010 15:47) Added new observation of RACE: African American  (10/06/2010 15:47) Added new observation of PATNTCOUNTY: [place]  (10/06/2010 15:47)

## 2010-10-23 NOTE — Miscellaneous (Signed)
  Clinical Lists Changes  Observations: Added new observation of HOUSING: Stable/permanent (10/15/2010 12:01)

## 2010-10-24 ENCOUNTER — Ambulatory Visit (INDEPENDENT_AMBULATORY_CARE_PROVIDER_SITE_OTHER): Payer: Self-pay

## 2010-10-24 ENCOUNTER — Other Ambulatory Visit: Payer: Self-pay | Admitting: Internal Medicine

## 2010-10-24 ENCOUNTER — Encounter: Payer: Self-pay | Admitting: Internal Medicine

## 2010-10-24 DIAGNOSIS — B20 Human immunodeficiency virus [HIV] disease: Secondary | ICD-10-CM

## 2010-10-24 DIAGNOSIS — Z124 Encounter for screening for malignant neoplasm of cervix: Secondary | ICD-10-CM

## 2010-10-29 NOTE — Assessment & Plan Note (Addendum)
Summary: PAP smear, RESCHEDULE FROM 1/27   Vitals Entered By: Jennet Maduro RN (October 24, 2010 10:42 AM) CC: PAP smear visit.  Pt. given condoms.  Pt. given educational materials re:  HIV and women, diet, exercise, nutrition, self-esteem and BSE.   CC:  PAP smear visit.  Pt. given condoms.  Pt. given educational materials re:  HIV and women, diet, exercise, nutrition, and self-esteem and BSE.Marland Kitchen  Allergies: 1)  ! Sulfa 2)  ! Benadryl 3)  ! * Latex   Other Orders: Est. Patient Level I (82956) T-PAP [facility]) [phone])   Orders Added: 1)  Est. Patient Level I [99211] 2)  T-PAP [facility]) [65784]

## 2010-10-30 ENCOUNTER — Encounter (INDEPENDENT_AMBULATORY_CARE_PROVIDER_SITE_OTHER): Payer: Self-pay | Admitting: *Deleted

## 2010-11-04 NOTE — Letter (Signed)
Summary: Results Follow-up Letter  [facility]  [address]   [place], [place] [address]   Phone: [phone]  Fax: [phone]          October 31, 2010  [address] [place], [place]  [address]  [place]  Dear Ms. Reha,   The following are the results of your recent test(s):  Test     Result     Pap Smear    Normal__XXX__  Not Normal_____  Comments:  Everything was normal.  I will see you in one year for your next PAP smear.  Thank you for coming to the Center for your care.  Sincerely,   Jennet Maduro [facility]

## 2010-11-13 ENCOUNTER — Emergency Department (HOSPITAL_COMMUNITY): Payer: Self-pay

## 2010-11-13 ENCOUNTER — Inpatient Hospital Stay (INDEPENDENT_AMBULATORY_CARE_PROVIDER_SITE_OTHER)
Admission: RE | Admit: 2010-11-13 | Discharge: 2010-11-13 | Disposition: A | Discharge status: Home / Self Care | Payer: Self-pay | Source: Ambulatory Visit | Attending: Family Medicine | Admitting: Family Medicine

## 2010-11-13 ENCOUNTER — Emergency Department (HOSPITAL_COMMUNITY)
Admission: EM | Admit: 2010-11-13 | Discharge: 2010-11-14 | Disposition: A | Discharge status: Home / Self Care | Payer: Self-pay | Attending: Emergency Medicine | Admitting: Emergency Medicine

## 2010-11-13 ENCOUNTER — Encounter: Payer: Self-pay | Admitting: Internal Medicine

## 2010-11-13 ENCOUNTER — Telehealth: Payer: Self-pay | Admitting: *Deleted

## 2010-11-13 DIAGNOSIS — R0602 Shortness of breath: Secondary | ICD-10-CM | POA: Insufficient documentation

## 2010-11-13 DIAGNOSIS — R05 Cough: Secondary | ICD-10-CM | POA: Insufficient documentation

## 2010-11-13 DIAGNOSIS — Z21 Asymptomatic human immunodeficiency virus [HIV] infection status: Secondary | ICD-10-CM | POA: Insufficient documentation

## 2010-11-13 DIAGNOSIS — R070 Pain in throat: Secondary | ICD-10-CM | POA: Insufficient documentation

## 2010-11-13 DIAGNOSIS — R509 Fever, unspecified: Secondary | ICD-10-CM

## 2010-11-13 DIAGNOSIS — R059 Cough, unspecified: Secondary | ICD-10-CM | POA: Insufficient documentation

## 2010-11-13 DIAGNOSIS — B20 Human immunodeficiency virus [HIV] disease: Secondary | ICD-10-CM

## 2010-11-13 DIAGNOSIS — E860 Dehydration: Secondary | ICD-10-CM | POA: Insufficient documentation

## 2010-11-13 DIAGNOSIS — R000 Tachycardia, unspecified: Secondary | ICD-10-CM | POA: Insufficient documentation

## 2010-11-13 DIAGNOSIS — R5381 Other malaise: Secondary | ICD-10-CM | POA: Insufficient documentation

## 2010-11-13 DIAGNOSIS — Z79899 Other long term (current) drug therapy: Secondary | ICD-10-CM | POA: Insufficient documentation

## 2010-11-13 DIAGNOSIS — R5383 Other fatigue: Secondary | ICD-10-CM | POA: Insufficient documentation

## 2010-11-13 LAB — COMPREHENSIVE METABOLIC PANEL
AST: 38 U/L — ABNORMAL HIGH (ref 0–37)
Albumin: 4 g/dL (ref 3.5–5.2)
BUN: 8 mg/dL (ref 6–23)
CO2: 26 mEq/L (ref 19–32)
Creatinine, Ser: 1.1 mg/dL (ref 0.4–1.2)
GFR calc Af Amer: >60 mL/min (ref >60)
GFR calc non Af Amer: 56 mL/min — ABNORMAL LOW (ref >60)
Sodium: 136 mEq/L (ref 135–145)
Total Bilirubin: 1.9 mg/dL — ABNORMAL HIGH (ref 0.3–1.2)
Total Protein: 7.7 g/dL (ref 6.0–8.3)

## 2010-11-13 LAB — URINALYSIS, ROUTINE W REFLEX MICROSCOPIC
Glucose, UA: NEGATIVE mg/dL
Hgb urine dipstick: NEGATIVE
Ketones, ur: NEGATIVE mg/dL
Protein, ur: NEGATIVE mg/dL
Specific Gravity, Urine: 1.015 (ref 1.005–1.030)
Urobilinogen, UA: 0.2 mg/dL (ref 0.0–1.0)
pH: 5.5 (ref 5.0–8.0)

## 2010-11-13 LAB — DIFFERENTIAL
Lymphocytes Relative: 11 % — ABNORMAL LOW (ref 12–46)
Lymphs Abs: 0.9 10*3/uL (ref 0.7–4.0)
Monocytes Absolute: 0.6 10*3/uL (ref 0.1–1.0)

## 2010-11-13 LAB — CBC
HCT: 24 % — ABNORMAL LOW (ref 36.0–46.0)
MCH: 32.4 pg (ref 26.0–34.0)
MCHC: 35 g/dL (ref 30.0–36.0)
RBC: 2.59 MIL/uL — ABNORMAL LOW (ref 3.87–5.11)

## 2010-11-13 LAB — LACTIC ACID, PLASMA: Lactic Acid, Venous: 1 mmol/L (ref 0.5–2.2)

## 2010-11-13 LAB — RAPID STREP SCREEN (MED CTR MEBANE ONLY): Streptococcus, Group A Screen (Direct): NEGATIVE

## 2010-11-13 LAB — POCT PREGNANCY, URINE: Preg Test, Ur: NEGATIVE

## 2010-11-14 LAB — PROLACTIN: Prolactin: 11 ng/mL

## 2010-11-15 LAB — URINE CULTURE: Culture  Setup Time: 201203082102

## 2010-11-18 LAB — T-HELPER CELL (CD4) - (RCID CLINIC ONLY): CD4 T Cell Abs: 50 uL — ABNORMAL LOW (ref 400–2700)

## 2010-11-18 NOTE — Progress Notes (Signed)
  Phone Note Call from Patient   Caller: Patient Summary of Call: walked in c/o aches & chills x 2 days. wants to be seen. no appts available. sent to UC. she is very stressed over her dtr who has been in hospital & remains sick. she agreed to go to [facility] Initial call taken by: Golden Circle RN,  November 13, 2010 4:00 PM

## 2010-11-18 NOTE — Miscellaneous (Signed)
Summary: ED Visit  Clinical Lists Changes Pt is a 40y/o woman with HIV, who was sent to the [facility] ED from urgent care after being found to be febrile to 103. Patient reports generally not feeling well since yesterday, and complained of dry cough, sorethroat and generalized body weakness. She denied any chest pain, trouble breathing, palpitations, change in appetitie, abd pain, nausea, vomiting or diarrhea. In the ED, she was given Tylenol and started on IVFs, IV Vanc + Zosyn. When we evaluated the patient, she stated that she was feeling much better after getting the IVFs. On physical exam, she appeared to be in no distress, her pharynx was pink, moist and without exudates, neck without adenopathy, heart sounded regular without tachycardia, lungs were clear bilaterally, abdomen was soft, non tender with normal bowel sounds, skin was warm, and extremities without edema. Her vitals showed a tmax of 101.2, blood pressure of 113/50, RR of 18 and pulse of 100. On ambulation, her O2 sat was 93% on RA. Patient had no white count (WBC of 8.2), Hb stable at baseline  ~8.4 from back in 8/11, electrolytes were within normal limits and a CXR was completely normal. Rapid strep ag was negative.  Likely patient is having a viral URI and dehydration, however, quite curious that she spiked a temperature to 103 while on prophylactic antibitoics, including Azithro. Given her tachycardia and fever, will give a total of 3L fluid boluses and will discharge patient home with plan for best rest, hydration and to followup at the ID clinic within the next 1-3 days to f/u on patient's symptoms, check a CMET (bilirubin was slightly elevated at 1.9), creatitne slightly elevated as well.   Sodium (NA)                              136               135-145          mEq/L  Potassium (K)                            3.7               3.5-5.1          mEq/L  Chloride                                 101               96-112           mEq/L  CO2                                       26                19-32            mEq/L  Glucose                                  106        h      70-99            mg/dL  BUN  8                 6-23             mg/dL  Creatinine                               1.10              0.4-1.2          mg/dL  GFR, Est Non African American            56         l      >60              mL/min  GFR, Est African American                >60               >60              mL/min    Oversized comment, see footnote  1  Bilirubin, Total                         1.9        h      0.3-1.2          mg/dL  Alkaline Phosphatase                     71                39-117           U/L  SGOT (AST)                               38         h      0-37             U/L  SGPT (ALT)                               20                0-35             U/L  Total  Protein                           7.7               6.0-8.3          g/dL  Albumin-Blood                            4.0               3.5-5.2          g/dL  Calcium                                  9.0               8.4-10.5         mg/dL   WBC  8.2               4.0-10.5         K/uL  RBC                                      2.59       l      3.87-5.11        MIL/uL  Hemoglobin (HGB)                         8.4        l      12.0-15.0        g/dL  Hematocrit (HCT)                         24.0       l      36.0-46.0        %  MCV                                      92.7              78.0-100.0       fL  MCH -                                    32.4              26.0-34.0        pg  MCHC                                     35.0              30.0-36.0        g/dL  RDW                                      13.1              11.5-15.5        %  Platelet Count (PLT)                     288               150-400          K/uL  Neutrophils, %                           77                43-77            %   Lymphocytes, %                           11         l      12-46            %  Monocytes, %  8                 3-12             %  Eosinophils, %                           1                 0-5              %  Basophils, %                             3          h      0-1              %  Neutrophils, Absolute                    6.3               1.7-7.7          K/uL  Lymphocytes, Absolute                    0.9               0.7-4.0          K/uL  Monocytes, Absolute                      0.6               0.1-1.0          K/uL  Eosinophils, Absolute                    0.1               0.0-0.7          K/uL  Basophils, Absolute                      0.3        h      0.0-0.1          K/uL  CXR: IMPRESSION:   No acute cardiopulmonary findings.

## 2010-11-20 LAB — CBC
HCT: 19.3 % — ABNORMAL LOW (ref 36.0–46.0)
HCT: 31.2 % — ABNORMAL LOW (ref 36.0–46.0)
Hemoglobin: 10.5 g/dL — ABNORMAL LOW (ref 12.0–15.0)
Hemoglobin: 7.3 g/dL — ABNORMAL LOW (ref 12.0–15.0)
Hemoglobin: 7.5 g/dL — ABNORMAL LOW (ref 12.0–15.0)
MCH: 28.5 pg (ref 26.0–34.0)
MCH: 28.7 pg (ref 26.0–34.0)
MCH: 28.8 pg (ref 26.0–34.0)
MCH: 29 pg (ref 26.0–34.0)
MCH: 29.2 pg (ref 26.0–34.0)
MCH: 31.2 pg (ref 26.0–34.0)
MCH: 31.5 pg (ref 26.0–34.0)
MCHC: 33.7 g/dL (ref 30.0–36.0)
MCHC: 33.9 g/dL (ref 30.0–36.0)
MCHC: 34.2 g/dL (ref 30.0–36.0)
MCHC: 34.7 g/dL (ref 30.0–36.0)
MCHC: 36.1 g/dL — ABNORMAL HIGH (ref 30.0–36.0)
MCHC: 36.7 g/dL — ABNORMAL HIGH (ref 30.0–36.0)
MCV: 84.3 fL (ref 78.0–100.0)
MCV: 84.5 fL (ref 78.0–100.0)
MCV: 84.9 fL (ref 78.0–100.0)
MCV: 85.4 fL (ref 78.0–100.0)
MCV: 85.6 fL (ref 78.0–100.0)
MCV: 85.7 fL (ref 78.0–100.0)
MCV: 86.2 fL (ref 78.0–100.0)
MCV: 88.6 fL (ref 78.0–100.0)
Platelets: 123 10*3/uL — ABNORMAL LOW (ref 150–400)
Platelets: 178 10*3/uL (ref 150–400)
Platelets: 204 10*3/uL (ref 150–400)
Platelets: 217 10*3/uL (ref 150–400)
Platelets: 257 10*3/uL (ref 150–400)
Platelets: 317 10*3/uL (ref 150–400)
Platelets: 319 10*3/uL (ref 150–400)
Platelets: 386 10*3/uL (ref 150–400)
RBC: 2.47 MIL/uL — ABNORMAL LOW (ref 3.87–5.11)
RBC: 2.55 MIL/uL — ABNORMAL LOW (ref 3.87–5.11)
RBC: 2.89 MIL/uL — ABNORMAL LOW (ref 3.87–5.11)
RBC: 3.05 MIL/uL — ABNORMAL LOW (ref 3.87–5.11)
RBC: 3.56 MIL/uL — ABNORMAL LOW (ref 3.87–5.11)
RBC: 3.69 MIL/uL — ABNORMAL LOW (ref 3.87–5.11)
RDW: 17.6 % — ABNORMAL HIGH (ref 11.5–15.5)
RDW: 18.6 % — ABNORMAL HIGH (ref 11.5–15.5)
RDW: 19.5 % — ABNORMAL HIGH (ref 11.5–15.5)
RDW: 19.8 % — ABNORMAL HIGH (ref 11.5–15.5)
RDW: 20 % — ABNORMAL HIGH (ref 11.5–15.5)
RDW: 20 % — ABNORMAL HIGH (ref 11.5–15.5)
RDW: 20 % — ABNORMAL HIGH (ref 11.5–15.5)
WBC: 3.9 10*3/uL — ABNORMAL LOW (ref 4.0–10.5)
WBC: 4.9 10*3/uL (ref 4.0–10.5)
WBC: 6.3 10*3/uL (ref 4.0–10.5)
WBC: 6.4 10*3/uL (ref 4.0–10.5)

## 2010-11-20 LAB — BASIC METABOLIC PANEL
BUN: 10 mg/dL (ref 6–23)
BUN: 4 mg/dL — ABNORMAL LOW (ref 6–23)
BUN: 7 mg/dL (ref 6–23)
BUN: 7 mg/dL (ref 6–23)
BUN: 8 mg/dL (ref 6–23)
BUN: 8 mg/dL (ref 6–23)
BUN: <1 mg/dL — ABNORMAL LOW (ref 6–23)
CO2: 22 mEq/L (ref 19–32)
CO2: 23 mEq/L (ref 19–32)
CO2: 25 mEq/L (ref 19–32)
Calcium: 7.8 mg/dL — ABNORMAL LOW (ref 8.4–10.5)
Calcium: 8.1 mg/dL — ABNORMAL LOW (ref 8.4–10.5)
Calcium: 8.7 mg/dL (ref 8.4–10.5)
Calcium: 8.7 mg/dL (ref 8.4–10.5)
Calcium: 8.7 mg/dL (ref 8.4–10.5)
Chloride: 102 mEq/L (ref 96–112)
Chloride: 102 mEq/L (ref 96–112)
Chloride: 106 mEq/L (ref 96–112)
Chloride: 107 mEq/L (ref 96–112)
Chloride: 108 mEq/L (ref 96–112)
Chloride: 96 mEq/L (ref 96–112)
Creatinine, Ser: 0.6 mg/dL (ref 0.4–1.2)
Creatinine, Ser: 0.66 mg/dL (ref 0.4–1.2)
Creatinine, Ser: 0.66 mg/dL (ref 0.4–1.2)
Creatinine, Ser: 0.75 mg/dL (ref 0.4–1.2)
Creatinine, Ser: 0.81 mg/dL (ref 0.4–1.2)
Creatinine, Ser: 0.96 mg/dL (ref 0.4–1.2)
Creatinine, Ser: 0.98 mg/dL (ref 0.4–1.2)
Creatinine, Ser: 1.03 mg/dL (ref 0.4–1.2)
GFR calc Af Amer: >60 mL/min (ref >60)
GFR calc Af Amer: >60 mL/min (ref >60)
GFR calc Af Amer: >60 mL/min (ref >60)
GFR calc Af Amer: >60 mL/min (ref >60)
GFR calc non Af Amer: >60 mL/min (ref >60)
GFR calc non Af Amer: >60 mL/min (ref >60)
GFR calc non Af Amer: >60 mL/min (ref >60)
GFR calc non Af Amer: >60 mL/min (ref >60)
Glucose, Bld: 101 mg/dL — ABNORMAL HIGH (ref 70–99)
Glucose, Bld: 105 mg/dL — ABNORMAL HIGH (ref 70–99)
Glucose, Bld: 110 mg/dL — ABNORMAL HIGH (ref 70–99)
Glucose, Bld: 91 mg/dL (ref 70–99)
Potassium: 3.3 mEq/L — ABNORMAL LOW (ref 3.5–5.1)
Potassium: 3.4 mEq/L — ABNORMAL LOW (ref 3.5–5.1)
Sodium: 130 mEq/L — ABNORMAL LOW (ref 135–145)
Sodium: 134 mEq/L — ABNORMAL LOW (ref 135–145)

## 2010-11-20 LAB — CULTURE, BLOOD (ROUTINE X 2)
Culture  Setup Time: 201203090235
Culture  Setup Time: 201203090235
Culture: NO GROWTH
Culture: NO GROWTH

## 2010-11-20 LAB — DIFFERENTIAL
Basophils Absolute: 0 10*3/uL (ref 0.0–0.1)
Basophils Relative: 0 % (ref 0–1)
Eosinophils Absolute: 0 10*3/uL (ref 0.0–0.7)
Eosinophils Relative: 0 % (ref 0–5)
Lymphocytes Relative: 6 % — ABNORMAL LOW (ref 12–46)
Lymphs Abs: 0.4 K/uL — ABNORMAL LOW (ref 0.7–4.0)
Monocytes Absolute: 0.5 10*3/uL (ref 0.1–1.0)
Monocytes Relative: 8 % (ref 3–12)
Neutro Abs: 5.4 10*3/uL (ref 1.7–7.7)
Neutrophils Relative %: 86 % — ABNORMAL HIGH (ref 43–77)

## 2010-11-20 LAB — CROSSMATCH
ABO/RH(D): B POS
Antibody Screen: POSITIVE
Antibody Screen: POSITIVE
DAT, IgG: NEGATIVE

## 2010-11-20 LAB — BASIC METABOLIC PANEL WITH GFR
CO2: 26 meq/L (ref 19–32)
Calcium: 8.9 mg/dL (ref 8.4–10.5)
GFR calc non Af Amer: >60 mL/min (ref >60)
Glucose, Bld: 124 mg/dL — ABNORMAL HIGH (ref 70–99)

## 2010-11-20 LAB — CARDIAC PANEL(CRET KIN+CKTOT+MB+TROPI)
CK, MB: 0.3 ng/mL (ref 0.3–4.0)
CK, MB: 0.4 ng/mL (ref 0.3–4.0)
Relative Index: INVALID (ref 0.0–2.5)
Relative Index: INVALID (ref 0.0–2.5)
Total CK: 51 U/L (ref 7–177)
Troponin I: 0.01 ng/mL (ref 0.00–0.06)
Troponin I: 0.02 ng/mL (ref 0.00–0.06)
Troponin I: 0.03 ng/mL (ref 0.00–0.06)

## 2010-11-20 LAB — URINE MICROSCOPIC-ADD ON

## 2010-11-20 LAB — URINE CULTURE
Colony Count: >=100000
Culture  Setup Time: 201108261038

## 2010-11-20 LAB — CLOSTRIDIUM DIFFICILE EIA: C difficile Toxins A+B, EIA: NEGATIVE

## 2010-11-20 LAB — URINALYSIS, ROUTINE W REFLEX MICROSCOPIC
Bilirubin Urine: NEGATIVE
Glucose, UA: NEGATIVE mg/dL
Nitrite: NEGATIVE
Protein, ur: 100 mg/dL — AB
Specific Gravity, Urine: 1.014 (ref 1.005–1.030)
Urobilinogen, UA: 0.2 mg/dL (ref 0.0–1.0)
pH: 7 (ref 5.0–8.0)

## 2010-11-20 LAB — HEMOCCULT GUIAC POC 1CARD (OFFICE)
Fecal Occult Bld: NEGATIVE
Fecal Occult Bld: NEGATIVE

## 2010-11-20 LAB — ABO/RH: ABO/RH(D): B POS

## 2010-11-20 LAB — IRON AND TIBC
Iron: 15 ug/dL — ABNORMAL LOW (ref 42–135)
UIBC: 149 ug/dL

## 2010-11-20 LAB — RETICULOCYTES: RBC.: 2.37 MIL/uL — ABNORMAL LOW (ref 3.87–5.11)

## 2010-11-20 LAB — FERRITIN: Ferritin: 288 ng/mL (ref 10–291)

## 2010-11-21 LAB — CBC
HCT: 17.9 % — ABNORMAL LOW (ref 36.0–46.0)
HCT: 19.9 % — ABNORMAL LOW (ref 36.0–46.0)
HCT: 20.8 % — ABNORMAL LOW (ref 36.0–46.0)
Hemoglobin: 6.2 g/dL — CL (ref 12.0–15.0)
Hemoglobin: 6.3 g/dL — CL (ref 12.0–15.0)
Hemoglobin: 7.1 g/dL — ABNORMAL LOW (ref 12.0–15.0)
Hemoglobin: 7.1 g/dL — ABNORMAL LOW (ref 12.0–15.0)
Hemoglobin: 8.5 g/dL — ABNORMAL LOW (ref 12.0–15.0)
MCH: 30.9 pg (ref 26.0–34.0)
MCH: 28.5 pg (ref 26.0–34.0)
MCHC: 33.5 g/dL (ref 30.0–36.0)
MCHC: 33.7 g/dL (ref 30.0–36.0)
MCHC: 34.6 g/dL (ref 30.0–36.0)
MCHC: 35.4 g/dL (ref 30.0–36.0)
MCHC: 35.7 g/dL (ref 30.0–36.0)
MCHC: 34.2 g/dL (ref 30.0–36.0)
MCV: 86.4 fL (ref 78.0–100.0)
MCV: 86.5 fL (ref 78.0–100.0)
Platelets: 178 10*3/uL (ref 150–400)
Platelets: 209 10*3/uL (ref 150–400)
Platelets: 235 10*3/uL (ref 150–400)
Platelets: 247 10*3/uL (ref 150–400)
Platelets: 283 10*3/uL (ref 150–400)
RBC: 2.06 MIL/uL — ABNORMAL LOW (ref 3.87–5.11)
RBC: 2.44 MIL/uL — ABNORMAL LOW (ref 3.87–5.11)
RBC: 2.99 MIL/uL — ABNORMAL LOW (ref 3.87–5.11)
RDW: 16.9 % — ABNORMAL HIGH (ref 11.5–15.5)
RDW: 17.2 % — ABNORMAL HIGH (ref 11.5–15.5)
RDW: 17.4 % — ABNORMAL HIGH (ref 11.5–15.5)
RDW: 17.8 % — ABNORMAL HIGH (ref 11.5–15.5)
RDW: 18 % — ABNORMAL HIGH (ref 11.5–15.5)
RDW: 18 % — ABNORMAL HIGH (ref 11.5–15.5)
WBC: 4.4 10*3/uL (ref 4.0–10.5)
WBC: 4.9 10*3/uL (ref 4.0–10.5)
WBC: 5.1 10*3/uL (ref 4.0–10.5)
WBC: 6.9 10*3/uL (ref 4.0–10.5)

## 2010-11-21 LAB — COMPREHENSIVE METABOLIC PANEL
ALT: 15 U/L (ref 0–35)
ALT: 9 U/L (ref 0–35)
AST: 54 U/L — ABNORMAL HIGH (ref 0–37)
AST: 31 U/L (ref 0–37)
Albumin: 3.5 g/dL (ref 3.5–5.2)
BUN: 4 mg/dL — ABNORMAL LOW (ref 6–23)
CO2: 22 mEq/L (ref 19–32)
Calcium: 6.7 mg/dL — ABNORMAL LOW (ref 8.4–10.5)
Calcium: 7.3 mg/dL — ABNORMAL LOW (ref 8.4–10.5)
Calcium: 9.2 mg/dL (ref 8.4–10.5)
Chloride: 115 mEq/L — ABNORMAL HIGH (ref 96–112)
Creatinine, Ser: 0.65 mg/dL (ref 0.4–1.2)
Creatinine, Ser: 0.85 mg/dL (ref 0.4–1.2)
GFR calc Af Amer: >60 mL/min (ref >60)
GFR calc Af Amer: >60 mL/min (ref >60)
GFR calc non Af Amer: >60 mL/min (ref >60)
Sodium: 138 mEq/L (ref 135–145)
Sodium: 137 mEq/L (ref 135–145)
Total Bilirubin: 0.6 mg/dL (ref 0.3–1.2)
Total Protein: 6.4 g/dL (ref 6.0–8.3)

## 2010-11-21 LAB — CROSSMATCH
ABO/RH(D): B POS
Antibody Screen: POSITIVE

## 2010-11-21 LAB — RAPID URINE DRUG SCREEN, HOSP PERFORMED
Barbiturates: NOT DETECTED
Benzodiazepines: NOT DETECTED
Cocaine: NOT DETECTED

## 2010-11-21 LAB — BASIC METABOLIC PANEL
BUN: 4 mg/dL — ABNORMAL LOW (ref 6–23)
BUN: 5 mg/dL — ABNORMAL LOW (ref 6–23)
Calcium: 8.1 mg/dL — ABNORMAL LOW (ref 8.4–10.5)
Calcium: 8.5 mg/dL (ref 8.4–10.5)
Chloride: 109 mEq/L (ref 96–112)
Creatinine, Ser: 0.61 mg/dL (ref 0.4–1.2)
Creatinine, Ser: 0.66 mg/dL (ref 0.4–1.2)
GFR calc Af Amer: >60 mL/min (ref >60)
GFR calc non Af Amer: >60 mL/min (ref >60)
GFR calc non Af Amer: >60 mL/min (ref >60)
Glucose, Bld: 105 mg/dL — ABNORMAL HIGH (ref 70–99)
Glucose, Bld: 137 mg/dL — ABNORMAL HIGH (ref 70–99)
Glucose, Bld: 83 mg/dL (ref 70–99)
Potassium: 3.2 mEq/L — ABNORMAL LOW (ref 3.5–5.1)
Potassium: 3.8 mEq/L (ref 3.5–5.1)
Sodium: 139 mEq/L (ref 135–145)

## 2010-11-21 LAB — POCT I-STAT 3, ART BLOOD GAS (G3+)
O2 Saturation: 99 %
TCO2: 23 mmol/L (ref 0–100)
pCO2 arterial: 41.2 mmHg (ref 35.0–45.0)
pO2, Arterial: 144 mmHg — ABNORMAL HIGH (ref 80.0–100.0)

## 2010-11-21 LAB — DIC (DISSEMINATED INTRAVASCULAR COAGULATION)PANEL
Fibrinogen: 349 mg/dL (ref 204–475)
Smear Review: NONE SEEN
aPTT: 29 seconds (ref 24–37)

## 2010-11-21 LAB — IRON AND TIBC
Saturation Ratios: 8 % — ABNORMAL LOW (ref 20–55)
TIBC: 150 ug/dL — ABNORMAL LOW (ref 250–470)

## 2010-11-21 LAB — ACETAMINOPHEN LEVEL: Acetaminophen (Tylenol), Serum: <10 ug/mL — ABNORMAL LOW (ref 10–30)

## 2010-11-21 LAB — LACTIC ACID, PLASMA
Lactic Acid, Venous: 0.4 mmol/L — ABNORMAL LOW (ref 0.5–2.2)
Lactic Acid, Venous: 2.2 mmol/L (ref 0.5–2.2)

## 2010-11-21 LAB — DIFFERENTIAL
Basophils Absolute: 0 10*3/uL (ref 0.0–0.1)
Basophils Relative: 0 % (ref 0–1)
Eosinophils Absolute: 0.1 10*3/uL (ref 0.0–0.7)
Eosinophils Relative: 5 % (ref 0–5)
Eosinophils Relative: 1 % (ref 0–5)
Lymphocytes Relative: 5 % — ABNORMAL LOW (ref 12–46)
Lymphocytes Relative: 10 % — ABNORMAL LOW (ref 12–46)
Lymphs Abs: 0.4 10*3/uL — ABNORMAL LOW (ref 0.7–4.0)
Lymphs Abs: 0.9 10*3/uL (ref 0.7–4.0)
Monocytes Absolute: 0.6 10*3/uL (ref 0.1–1.0)
Monocytes Absolute: 0.8 10*3/uL (ref 0.1–1.0)
Monocytes Relative: 4 % (ref 3–12)
Monocytes Relative: 9 % (ref 3–12)
Neutro Abs: 3.9 10*3/uL (ref 1.7–7.7)

## 2010-11-21 LAB — CULTURE, BLOOD (ROUTINE X 2)
Culture: NO GROWTH
Report Status: 8142011
Report Status: 8142011

## 2010-11-21 LAB — LACTATE DEHYDROGENASE
LDH: 336 U/L — ABNORMAL HIGH (ref 94–250)
LDH: 412 U/L — ABNORMAL HIGH (ref 94–250)

## 2010-11-21 LAB — URINALYSIS, ROUTINE W REFLEX MICROSCOPIC
Bilirubin Urine: NEGATIVE
Glucose, UA: NEGATIVE mg/dL
Hgb urine dipstick: NEGATIVE
Protein, ur: NEGATIVE mg/dL
pH: 6.5 (ref 5.0–8.0)

## 2010-11-21 LAB — LEGIONELLA ANTIGEN, URINE: Legionella Antigen, Urine: NEGATIVE

## 2010-11-21 LAB — CULTURE, BLOOD (SINGLE)

## 2010-11-21 LAB — HAPTOGLOBIN: Haptoglobin: 103 mg/dL (ref 16–200)

## 2010-11-21 LAB — AMMONIA: Ammonia: 19 umol/L (ref 11–35)

## 2010-11-21 LAB — HIV-1 GENOTYPR PLUS

## 2010-11-21 LAB — PROCALCITONIN
Procalcitonin: <0.5 ng/mL
Procalcitonin: <0.5 ng/mL

## 2010-11-21 LAB — URINE MICROSCOPIC-ADD ON

## 2010-11-21 LAB — ETHANOL: Alcohol, Ethyl (B): <5 mg/dL (ref 0–10)

## 2010-11-21 LAB — TSH: TSH: 2.435 u[IU]/mL (ref 0.350–4.500)

## 2010-11-21 LAB — HIV-1 RNA ULTRAQUANT REFLEX TO GENTYP+: HIV 1 RNA Quant: 654000 copies/mL — ABNORMAL HIGH (ref <48)

## 2010-11-21 LAB — T-HELPER CELLS (CD4) COUNT (NOT AT ARMC)
CD4 % Helper T Cell: 1 % — ABNORMAL LOW (ref 33–55)
CD4 T Cell Abs: <10 uL — ABNORMAL LOW (ref 400–2700)

## 2010-11-21 LAB — MAGNESIUM: Magnesium: 1.5 mg/dL (ref 1.5–2.5)

## 2010-11-21 LAB — HIV 1/2 CONFIRMATION
HIV-1 antibody: POSITIVE
HIV-2 Ab: NEGATIVE

## 2010-11-21 LAB — AFB CULTURE, BLOOD

## 2010-12-17 ENCOUNTER — Ambulatory Visit (INDEPENDENT_AMBULATORY_CARE_PROVIDER_SITE_OTHER): Payer: Self-pay | Admitting: Internal Medicine

## 2010-12-17 ENCOUNTER — Encounter: Payer: Self-pay | Admitting: Internal Medicine

## 2010-12-17 ENCOUNTER — Other Ambulatory Visit: Payer: Self-pay | Admitting: *Deleted

## 2010-12-17 VITALS — BP 116/67 | HR 88 | Temp 97.9°F | Ht 66.0 in | Wt 155.8 lb

## 2010-12-17 DIAGNOSIS — J309 Allergic rhinitis, unspecified: Secondary | ICD-10-CM

## 2010-12-17 DIAGNOSIS — B20 Human immunodeficiency virus [HIV] disease: Secondary | ICD-10-CM

## 2010-12-17 DIAGNOSIS — J302 Other seasonal allergic rhinitis: Secondary | ICD-10-CM | POA: Insufficient documentation

## 2010-12-17 MED ORDER — RALTEGRAVIR POTASSIUM 400 MG PO TABS: 400.0000 mg | ORAL_TABLET | Freq: Two times a day (BID) | ORAL | Status: DC

## 2010-12-17 MED ORDER — FLUCONAZOLE 100 MG PO TABS: 100.0000 mg | ORAL_TABLET | ORAL | Status: DC

## 2010-12-17 MED ORDER — AZITHROMYCIN 500 MG PO TABS: 500.0000 mg | ORAL_TABLET | Freq: Every day | ORAL | Status: DC

## 2010-12-17 MED ORDER — LORATADINE 10 MG PO TABS: 10.0000 mg | ORAL_TABLET | Freq: Every day | ORAL | Status: DC

## 2010-12-17 MED ORDER — DAPSONE 100 MG PO TABS: 100.0000 mg | ORAL_TABLET | Freq: Every day | ORAL | Status: DC

## 2010-12-17 MED ORDER — EMTRICITABINE-TENOFOVIR DF 200-300 MG PO TABS: 1.0000 | ORAL_TABLET | Freq: Every day | ORAL | Status: DC

## 2010-12-17 NOTE — Assessment & Plan Note (Signed)
I will have her try over-the-counter Claritin.

## 2010-12-17 NOTE — Assessment & Plan Note (Signed)
It appears that her adherence continues to be very good. Her last 2 viral loads at the end of last year were undetectable. I will repeat a. Lab work today.

## 2010-12-17 NOTE — Progress Notes (Signed)
  Subjective:    Patient ID: Michelle Flores, female    DOB: March 10, 1971, 40 y.o.   MRN: [id number]  HPI Michelle Flores is in for her routine visit. Overall, she is doing much better. She has been very busy taking care of her children and has an appointment at [facility] to see a pediatrician with one of her daughters tomorrow to investigate concerns about sickle cell disease. One month ago she was hospitalized overnight with dehydration and improved promptly with fluid. There was no obvious cause for her dehydration at that time but she says that she simply was too busy and was not paying attention to eating and drinking enough.  She's been bothered recently by sinus congestion that she attributes to her seasonal allergies. She has not tried taking anything because she wanted to discuss options with me first.  She has not missed any doses of her HIV medications.    Review of Systems     Objective:   Physical Exam  Constitutional: She appears well-developed and well-nourished. No distress.  HENT:  Mouth/Throat: Oropharynx is clear and moist. No oropharyngeal exudate.  Cardiovascular: Normal rate and regular rhythm.   No murmur heard. Pulmonary/Chest: Breath sounds normal. She has no wheezes. She has no rales.  Psychiatric: She has a normal mood and affect.          Assessment & Plan:

## 2010-12-18 LAB — CBC
HCT: 30.6 % — ABNORMAL LOW (ref 36.0–46.0)
Hemoglobin: 10 g/dL — ABNORMAL LOW (ref 12.0–15.0)
MCHC: 32.7 g/dL (ref 30.0–36.0)
RBC: 3.44 MIL/uL — ABNORMAL LOW (ref 3.87–5.11)

## 2010-12-18 LAB — COMPREHENSIVE METABOLIC PANEL
Albumin: 4.5 g/dL (ref 3.5–5.2)
Alkaline Phosphatase: 155 U/L — ABNORMAL HIGH (ref 39–117)
BUN: 12 mg/dL (ref 6–23)
CO2: 27 mEq/L (ref 19–32)
Calcium: 9.7 mg/dL (ref 8.4–10.5)
Glucose, Bld: 87 mg/dL (ref 70–99)
Potassium: 3.8 mEq/L (ref 3.5–5.3)
Total Protein: 7.6 g/dL (ref 6.0–8.3)

## 2010-12-18 LAB — LIPID PANEL
Cholesterol: 128 mg/dL (ref 0–200)
HDL: 34 mg/dL — ABNORMAL LOW (ref >39)
LDL Cholesterol: 70 mg/dL (ref 0–99)
Triglycerides: 119 mg/dL (ref <150)

## 2010-12-19 LAB — T-HELPER CELL (CD4) - (RCID CLINIC ONLY)
CD4 % Helper T Cell: 19 % — ABNORMAL LOW (ref 33–55)
CD4 T Cell Abs: 180 uL — ABNORMAL LOW (ref 400–2700)

## 2010-12-22 LAB — HIV-1 RNA QUANT-NO REFLEX-BLD: HIV-1 RNA Quant, Log: <1.3 {Log} (ref <1.30)

## 2010-12-30 ENCOUNTER — Ambulatory Visit: Payer: Self-pay | Admitting: Internal Medicine

## 2011-01-09 ENCOUNTER — Telehealth: Payer: Self-pay | Admitting: *Deleted

## 2011-01-13 ENCOUNTER — Telehealth: Payer: Self-pay | Admitting: *Deleted

## 2011-01-13 NOTE — Telephone Encounter (Signed)
Has applied for [employer]AP & is still waiting for response. Out of meds-truvada, isentress,sthambutol,azithromycin. Used her bill money to get a half rx. No money left. She is very concerned. Asked her to make an appt with Desma Mcgregor to apply for PAP. Told her I will check with Tomasita Morrow, RN about sample if we have any . Will call her back at [phone]

## 2011-01-14 NOTE — Telephone Encounter (Signed)
[employer] pended Michelle Flores's application - she did not give enough information as to how she was living on low income - called her several times - she told us she inherited the her house from her parents along with money - Toni at [employer] asked for copies of banks statements - she said she was going to fax, but has not - we advised her that she will not receive her meds unless resolved. We also advised her that even if [employer] denied the app, due to income, there were other options like patient assistance programs.

## 2011-01-15 NOTE — Telephone Encounter (Signed)
I had called pt yesterday & told her above

## 2011-01-20 ENCOUNTER — Ambulatory Visit: Payer: Self-pay | Admitting: Internal Medicine

## 2011-01-21 ENCOUNTER — Encounter: Payer: Self-pay | Admitting: Internal Medicine

## 2011-01-21 ENCOUNTER — Ambulatory Visit (INDEPENDENT_AMBULATORY_CARE_PROVIDER_SITE_OTHER): Payer: Self-pay | Admitting: Internal Medicine

## 2011-01-21 VITALS — BP 123/71 | HR 96 | Temp 98.3°F | Ht 66.0 in | Wt 158.0 lb

## 2011-01-21 DIAGNOSIS — A318 Other mycobacterial infections: Secondary | ICD-10-CM

## 2011-01-21 DIAGNOSIS — J309 Allergic rhinitis, unspecified: Secondary | ICD-10-CM

## 2011-01-21 DIAGNOSIS — J302 Other seasonal allergic rhinitis: Secondary | ICD-10-CM

## 2011-01-21 DIAGNOSIS — B20 Human immunodeficiency virus [HIV] disease: Secondary | ICD-10-CM

## 2011-01-21 NOTE — Progress Notes (Signed)
  Subjective:    Patient ID: Michelle Flores, female    DOB: September 28, 1970, 40 y.o.   MRN: [id number]  HPI Michelle Flores is in for her routine visit. She denies missing any doses of her medications since her visit last month but does admit that she is under increasing financial pressure and is having difficulty affording her co-pays. She is waiting to hear if she has been approved for ADAP.    Review of Systems     Objective:   Physical Exam  Constitutional: She appears well-developed and well-nourished. No distress.  HENT:  Mouth/Throat: Oropharynx is clear and moist. No oropharyngeal exudate.  Cardiovascular: Normal rate, regular rhythm and normal heart sounds.   No murmur heard. Pulmonary/Chest: Breath sounds normal. She has no wheezes. She has no rales.  Psychiatric: She has a normal mood and affect.          Assessment & Plan:

## 2011-01-21 NOTE — Assessment & Plan Note (Signed)
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 She is having difficulty with he co-pays of her medications. She has now been on treatment for 9 months and her CD4 count is approaching 200. If she is not improved in for ADAP and cannot afford Zithromax and Myambutol and I have suggested that she stop both of them.

## 2011-01-21 NOTE — Assessment & Plan Note (Signed)
Her viral load remains undetectable and her CD4 has steadily reconstituted to 180. I will continue her current regimen.

## 2011-01-21 NOTE — Assessment & Plan Note (Signed)
Her allergic rhinitis has improved some with the addition of Claritin.

## 2011-01-23 NOTE — H&P (Signed)
[facility]  Patient:    Michelle Flores, Michelle Flores Visit Number: [id number] MRN: [id number]          Service Type: OBS Location: [facility] Attending Physician:  Pleas Koch Dictated by:   Ronda Fairly. Galen Daft, M.D. Admit Date:  01/08/2002   CC:         Georgina Peer, M.D.   History and Physical  CHIEF COMPLAINT:              Abdominal pain.  HISTORY OF PRESENT ILLNESS:   The patient is admitted through the emergency department for evaluation for onset of abdominal pain which intensified today. It has been going on for approximately a week but substantially more noticeable today.  The patient has been taking Tylenol, no other medications. She denies any problems with vomiting.  Her bowel habits have been okay.  No fever, no dysuria.  REVIEW OF SYSTEMS:            The remainder of the Review Of Systems is negative.  No bleeding.  No rupture of membranes.  Decreased but positive fetal movement.  PAST MEDICAL HISTORY:         This is the first pregnancy for this patient.  PAST OBSTETRICAL HISTORY:     First pregnancy, currently 33 weeks and six days gestation.  The patient is Rh-positive.  Uncomplicated prenatal record.  PHYSICAL EXAMINATION:  VITAL SIGNS:                  Stable.  Afebrile.  Blood pressure normal range. Pulse 68, respiratory rate 16.  GENERAL:                      Alert and oriented.  No apparent distress.  CARDIAC:                      Regular rate and rhythm.  No murmurs, rubs, or gallops.  RESPIRATORY:                  Clear to auscultation.  ABDOMEN:                      Soft, nontender except on the left side slightly.  Otherwise unremarkable.  No rebound, no masses.  Gravid uterus clearly palpable.  No hepatosplenomegaly.  Fetal heart rate is reactive with a 130 baseline, acceleration to 145-150 on several occasions.  There are contractions present on the monitor, approximately two to three minutes apart. These  are lasting approximately 45 seconds.  There is no other finding.  No late decelerations or other findings.  EXTREMITIES:                  No clubbing, cyanosis, or edema.  PELVIC:                       Normal external genitalia.  No lesions of the vulva.  No lesions of the vagina.  Perineum is normal.  Cervix is fingertip on the external os, closed internal os.  ASSESSMENT:                   Threatened preterm labor.  PLAN:                         1. Subcu terbutaline followed by oral  terbutaline if this is effective.                               2. Bed rest.  The patient is currently not                                  employed so bed rest is possible at home.                                  It is her first baby.                               3. She is advised also to follow up with                                  Dr. Elliot Gault for a follow-up visit after                                  this visit with all results and discharge                                  after observation. Dictated by:   Ronda Fairly. Galen Daft, M.D. Attending Physician:  Pleas Koch DD:  01/08/02 TD:  01/09/02 Job: [id number]

## 2011-01-23 NOTE — Op Note (Signed)
[facility]  Patient:    Michelle Flores, Michelle Flores Visit Number: [id number] MRN: [id number]          Service Type: OBS Location: [facility] Attending Physician:  Pleas Koch Dictated by:   Georgina Peer, M.D. Proc. Date: 11/29/01 Admit Date:  11/29/2001                             Operative Report  PREOPERATIVE DIAGNOSES:       Pregnancy 30 weeks with bleeding, endocervical polyp.  POSTOPERATIVE DIAGNOSES:      Pregnancy 30 weeks with bleeding, endocervical polyp.  OPERATION PERFORMED:          Removal of cervical polyp.  SURGEON:                      Georgina Peer, M.D.  ANESTHESIA:                   None.  ESTIMATED BLOOD LOSS:         Minimal.  INDICATIONS:                  This is a 40 year old black female [redacted] weeks pregnant with intermittent vaginal bleeding and discharge.  Found to have an endocervical polyp.  Because of the pregnancy state observation was employed but the patient continued to have bleeding and some uterine irritability and it was felt that the polyp was contributing to this and should be removed. Risks of bleeding, infection, and contractions were noted and accepted.  The patient was brought to maternity admissions and a procedure room for the procedure.  Fetal heart tones were obtained and were normal.  DESCRIPTION OF PROCEDURE:     The patients cervix was visualized with the speculum and the polyp was grasped with a ring forceps.  A 0 Vicryl endo loop was placed around the base of the polyp and tied securely.  The polyp was removed.  There was minimal bleeding.  Munsell solution cauterized the base of the cervix which appeared close.  Some uterine irritability was noted and the patient will be orally hydrated and subcutaneous terbutaline used if necessary.  The patient will continued modified rest for 24-36 hours and resume all normal activity on March 27.  She will call with severe cramping, regular  contractions, or heavy bleeding.  She will follow up in the office in one week. Dictated by:   Georgina Peer, M.D. Attending Physician:  Pleas Koch DD:  11/29/01 TD:  11/30/01 Job: [id number]

## 2011-01-23 NOTE — Op Note (Signed)
Michelle Flores, Michelle Flores                          ACCOUNT NO.:  [id number]   MEDICAL RECORD NO.:  [id number]                   PATIENT TYPE:  AMB   LOCATION:  [facility]                                  FACILITY:  [facility]   PHYSICIAN:  James A. Ashley Royalty, M.D.             DATE OF BIRTH:  August 29, 1971   DATE OF PROCEDURE:  09/25/2003  DATE OF DISCHARGE:                                 OPERATIVE REPORT   PREOPERATIVE DIAGNOSES:  1. Desire for attempt at permanent surgical sterilization.  2. Cervical intraepithelial neoplasia.   POSTOPERATIVE DIAGNOSES:  1. Desire for attempt at permanent surgical sterilization.  2. Cervical intraepithelial neoplasia.   PROCEDURES:  1. Laparoscopic bilateral tubal sterilization procedure (bipolar cautery).  2. Loop electrosurgical excision procedure (LEEP).   SURGEON:  Rudy Jew. Ashley Royalty, M.D.   ANESTHESIA:  General.   ESTIMATED BLOOD LOSS:  50 mL.   COMPLICATIONS:  None.   PACKS AND DRAINS:  None.   DESCRIPTION OF PROCEDURE:  The patient was taken to the operating room and  placed in the dorsal supine position, where after adequate general  anesthesia was administered she was placed in the lithotomy position and  prepped and draped in the usual manner for abdominal and vaginal surgery.  A  posterior weighted retractor was placed per vagina.  The anterior lip of the  cervix was grasped with a single-tooth tenaculum.  A Jarcho uterine  manipulator was placed per cervix and held in place with the tenaculum.  The  bladder was drained with a red rubber catheter.  After injecting several  milliliters of 0.5% Marcaine with 1:100,000 epinephrine, a 1.2 cm  infraumbilical incision was made in the longitudinal plane.  A Veress needle  was inserted into the abdominal cavity.  Its location was verified by  instillation of saline and hanging drop techniques.  Next 3 L of CO2 were  instilled at 1 L/min. to create a pneumoperitoneum.  Next a size 10/11  disposable  laparoscopic trocar was introduced into the abdominal cavity.  Its location was verified by placement of the laparoscope.  There was no  evidence of any trauma.  The pelvis was thoroughly surveyed.  A 5 mm midline  suprapubic trocar was placed in this effort.  Transillumination and direct  visualization techniques were employed.  The uterus was noted to be normal  size, shape, and contour without evidence of any fibroids or endometriosis.  The ovaries were normal size, shape, and contour bilaterally without  evidence of any cysts or endometriosis.  The fallopian tubes were normal  size, shape, contour, and length bilaterally.  The anterior and posterior  cul-de-sacs were unremarkable.  The remainder of the peritoneal surfaces  were smooth and glistening.  In short, there was no pathology noted.   Attention was then turned to the tubal sterilization procedure.  The  inferior trocar was removed and a Falope ring trocar was placed through the  same defects.  A Falope ring applicator with the Falope ring attached was  introduced into the abdominal cavity.  An attempt was made to place the  Falope ring on the right fallopian tube after tracing it to its fimbriated  end.  The tube began to tear, and hence this approach was abandoned.  The  Falope ring trocar and apparatus were removed.  The 5 mm trocar was replaced  with the same defects.  The procedure was then performed using a bipolar  cautery.  The Basile generator was set at setting #25.  The right fallopian  tube was grasped and traced to its fimbriated end.  Four centimeters of  fallopian tube was cauterized thoroughly.  The left fallopian tube was then  grasped and traced to its fimbriated end.  Four centimeters of fallopian  tube was cauterized thoroughly, beginning with the distal isthmic portion  and proceeding distally.  The area of cautery on the right tube was the  same.  At this point the patient was felt to have benefitted maximally  from  the laparoscopic portion of the procedure.  The laparoscopic instruments  were then removed and pneumoperitoneum evacuated.  The fascial defects were  closed with 0 Vicryl in interrupted fashion.  The skin was closed with 3-0  Monocryl in a subcuticular fashion.  A total of approximately 10 mL of 0.5%  Marcaine with 1:100,000 of epinephrine was instilled in the incisions to aid  in postoperative analgesia.   Attention was then turned to the loop electrical excision procedure.  The  Jarcho uterine manipulator was removed.  Approximately 16 mL of 2% Xylocaine  were instilled circumferentially around the cervix to aid in postoperative  analgesia.  The cervix was then painted with 5% acetic acid and visualized  through the colposcope.  The transformation zone was visualized.  A white  (large) loop was employed.  Using the cutting wave form at 70 watts power,  the LEEP cone was obtained without difficulty.  An incision was made in the  specimen (not oriented) with the scissors and the specimen was pinned out  and submitted to pathology in saline.  Hemostasis was easily obtained using  the ball electrode at 70 watts power and coagulation wave form.  Monsel's  solution was applied as well.  Hemostasis was noted.  The vaginal  instruments were removed and the procedure terminated.   The patient tolerated the procedure extremely well and was returned to the  recovery room in good condition.                                               James A. Ashley Royalty, M.D.    JAM/MEDQ  D:  09/25/2003  T:  09/25/2003  Job:  [id number]

## 2011-01-23 NOTE — H&P (Signed)
Michelle Flores, Michelle Flores                          ACCOUNT NO.:  [id number]   MEDICAL RECORD NO.:  [id number]                   PATIENT TYPE:  AMB   LOCATION:  [facility]                                  FACILITY:  [facility]   PHYSICIAN:  James A. Ashley Royalty, M.D.             DATE OF BIRTH:  Jul 02, 1971   DATE OF ADMISSION:  DATE OF DISCHARGE:                                HISTORY & PHYSICAL   This is a 40 year old gravida 2, para 2 who states a desire for attempt at  permanent surgical sterilization.  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  Colposcopic directed biopsy reveals CIN I as well.  The  patient was given a choice between expectant management, versus LEEP, versus  hysterectomy considering the additive indications and she has elected to go  with laparoscopic tubal sterilization as well as a LOOP electrical excision  procedure.   MEDICATIONS:  Vitamins.   PAST MEDICAL HISTORY:  Sickle trait.   PAST SURGICAL HISTORY:  Negative.   ALLERGIES:  None.   FAMILY HISTORY:  Noncontributory.   SOCIAL HISTORY:  The patient denies the use of tobacco or significant  alcohol.   REVIEW OF SYSTEMS:  Noncontributory.   PHYSICAL EXAMINATION:  GENERAL:  A well-developed, well-nourished, pleasant  black female in no acute distress.  VITAL SIGNS:  Stable.  SKIN:  Warm and dry without lesions.  LYMPH:  There is no supraclavicular, cervical, or inguinal adenopathy.  HEENT:  Normocephalic.  NECK:  Supple without lymphadenopathy.  LUNGS:  Clear.  CARDIAC:  Regular rate and rhythm without murmurs, gallops, or rubs.  BREASTS:  Without palpable masses, discharge, retraction, or adenopathy.  ABDOMEN:  Soft and nontender without masses or organomegaly.  Bowel sounds  are active.  MUSCULOSKELETAL:  Examination reveals full range of motion, without edema,  cyanosis, or CVA tenderness.  PELVIC:  (December 12) External genitalia within normal limits.  Vagina and  cervix are without gross  lesions.  Bimanual examination reveals the uterus  to be approximately 8 x 4 x 4 cm but no adnexal masses are palpable.   IMPRESSION:  1. Desire for attempt at permanent surgical sterilization.  2. 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   PLAN:  1. Laparoscopic bilateral tubal sterilization procedure.  2. LOOP electrical excision procedure.   The risks, benefits, complications and alternatives were fully discussed  with the patient.  The permanency and failure rates of various techniques  including Falope ring, bipolar cautery, and minilaparotomy with partial  salpingectomy have been discussed and accepted.  Alternative of hysterectomy  discussed and declined.  The patient understands the possibility of  exploratory laparotomy or hysterectomy and agrees to same.  Questions were  provided and answered.  James A. Ashley Royalty, M.D.    JAM/MEDQ  D:  09/24/2003  T:  09/24/2003  Job:  [id number]

## 2011-05-21 ENCOUNTER — Ambulatory Visit: Payer: Self-pay

## 2011-05-22 ENCOUNTER — Other Ambulatory Visit (INDEPENDENT_AMBULATORY_CARE_PROVIDER_SITE_OTHER): Payer: Self-pay

## 2011-05-22 ENCOUNTER — Ambulatory Visit: Payer: Self-pay

## 2011-05-22 DIAGNOSIS — B20 Human immunodeficiency virus [HIV] disease: Secondary | ICD-10-CM

## 2011-05-22 LAB — T-HELPER CELL (CD4) - (RCID CLINIC ONLY)
CD4 % Helper T Cell: 20 % — ABNORMAL LOW (ref 33–55)
CD4 T Cell Abs: 190 uL — ABNORMAL LOW (ref 400–2700)

## 2011-05-25 LAB — HIV-1 RNA QUANT-NO REFLEX-BLD
HIV 1 RNA Quant: <20 copies/mL (ref <20)
HIV-1 RNA Quant, Log: <1.3 {Log} (ref <1.30)

## 2011-06-15 ENCOUNTER — Encounter: Payer: Self-pay | Admitting: Internal Medicine

## 2011-06-15 ENCOUNTER — Ambulatory Visit (INDEPENDENT_AMBULATORY_CARE_PROVIDER_SITE_OTHER): Payer: Self-pay | Admitting: Internal Medicine

## 2011-06-15 VITALS — BP 115/72 | HR 73 | Temp 98.1°F | Ht 66.0 in | Wt 162.0 lb

## 2011-06-15 DIAGNOSIS — B20 Human immunodeficiency virus [HIV] disease: Secondary | ICD-10-CM

## 2011-06-15 DIAGNOSIS — Z23 Encounter for immunization: Secondary | ICD-10-CM

## 2011-06-15 NOTE — Progress Notes (Signed)
Addended by: Alesia Morin F on: 06/15/2011 04:50 PM   Modules accepted: Orders

## 2011-06-15 NOTE — Assessment & Plan Note (Signed)
Her viral load remains undetectable at less than 20 and her CD4 count has slowly reconstituted to 190. I will continue her current regimen. I talked to her about the potential risks of both horizontal and vertical transmission of her HIV infection. Fortunately both of these risks are quite low but I have left it up to her and her boyfriend to determine what level of risk they are willing to assume. I've asked her to call me right away if she believes she is pregnant.  I will have her stay on her Mycobacterium avium medications at least until her next visit in 4 months.

## 2011-06-15 NOTE — Progress Notes (Signed)
  Subjective:    Patient ID: Michelle Flores, female    DOB: [birth year], 40 y.o.   MRN: [id number]  HPI Michelle Flores is in for her routine visit. She states that she is doing well has not had any specific problems since her last visit. She does not believe she is missed any doses of her medications. She is dating a new boyfriend and they have just recently started to be sexually active. He is aware of her HIV infection. He has been tested and is HIV negative. They have been using condoms consistently. She tells me that she has been considering marriage and possibly having another baby.    Review of Systems     Objective:   Physical Exam  Constitutional: She appears well-developed and well-nourished. No distress.  HENT:  Mouth/Throat: Oropharynx is clear and moist. No oropharyngeal exudate.  Eyes: Conjunctivae are normal.  Cardiovascular: Normal rate and normal heart sounds.   No murmur heard. Pulmonary/Chest: Breath sounds normal. She has no wheezes. She has no rales.  Skin: No rash noted.  Psychiatric: She has a normal mood and affect.          Assessment & Plan:Michelle Flores is in for her routine visit Michelle Flores is in for her routine visit

## 2011-06-19 ENCOUNTER — Other Ambulatory Visit: Payer: Self-pay | Admitting: *Deleted

## 2011-06-19 DIAGNOSIS — B20 Human immunodeficiency virus [HIV] disease: Secondary | ICD-10-CM

## 2011-06-19 MED ORDER — ETHAMBUTOL HCL 400 MG PO TABS: 800.0000 mg | ORAL_TABLET | Freq: Every day | ORAL | Status: DC

## 2011-06-19 MED ORDER — RALTEGRAVIR POTASSIUM 400 MG PO TABS: 400.0000 mg | ORAL_TABLET | Freq: Two times a day (BID) | ORAL | Status: DC

## 2011-06-19 MED ORDER — EMTRICITABINE-TENOFOVIR DF 200-300 MG PO TABS: 1.0000 | ORAL_TABLET | Freq: Every day | ORAL | Status: DC

## 2011-06-19 MED ORDER — AZITHROMYCIN 500 MG PO TABS: 500.0000 mg | ORAL_TABLET | Freq: Every day | ORAL | Status: DC

## 2011-09-20 IMAGING — CR DG CHEST 1V PORT
1 series · 1 of 1 positions shown · non-contrast
Comparison: None.

CLINICAL DATA: Fever, HIV, altered mental status

PORTABLE CHEST - 1 VIEW

[view not recorded]
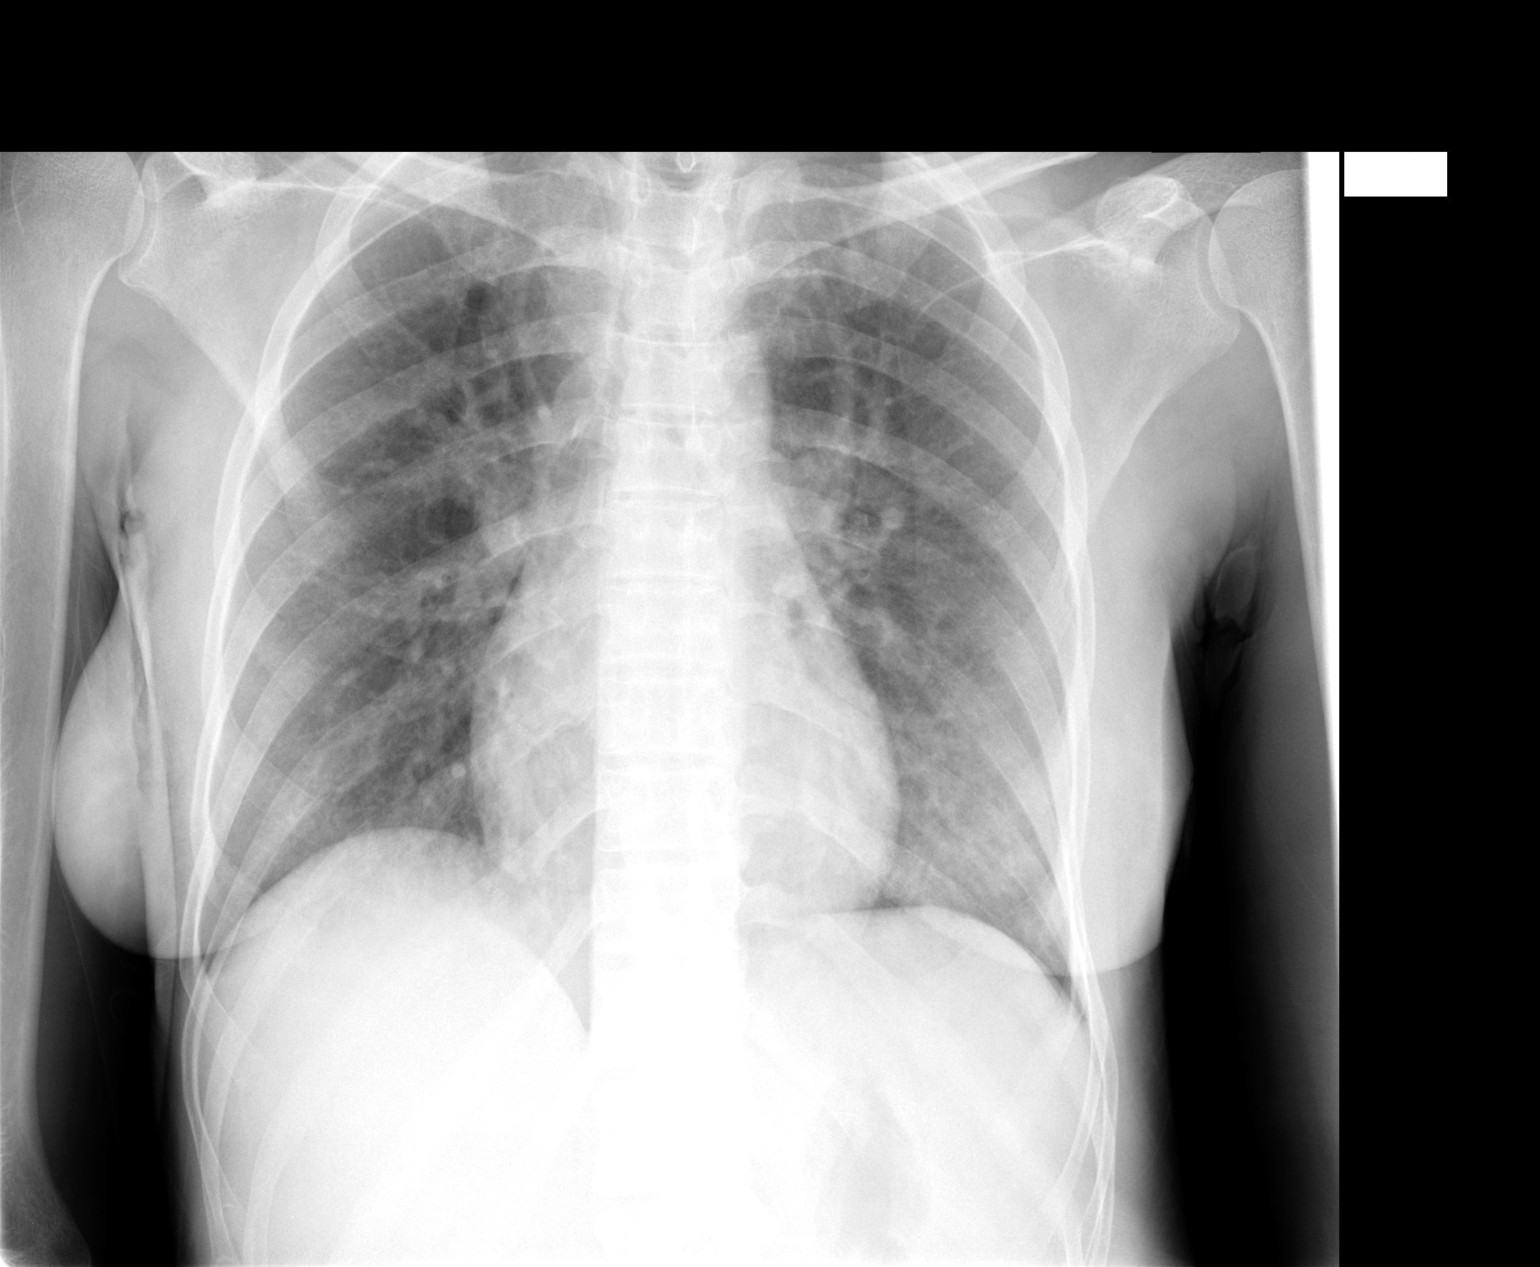

[1 of 1 positions shown; findings below may reference images not displayed]

FINDINGS: Normal heart size with prominent diffuse vascular
markings.  Ill-defined increased dense in the left lower lobe is
noted suspicious for developing pneumonia.  Negative for CHF,
effusion or pneumothorax.  Midline trachea.  Intact thorax.
IMPRESSION: Possible developing left lower lobe pneumonia.

## 2011-09-22 ENCOUNTER — Encounter: Payer: Self-pay | Admitting: Internal Medicine

## 2011-09-22 ENCOUNTER — Ambulatory Visit: Payer: Self-pay

## 2011-09-22 ENCOUNTER — Ambulatory Visit (INDEPENDENT_AMBULATORY_CARE_PROVIDER_SITE_OTHER): Payer: Self-pay | Admitting: Internal Medicine

## 2011-09-22 VITALS — BP 117/76 | HR 76 | Temp 98.0°F | Ht 66.0 in | Wt 160.0 lb

## 2011-09-22 DIAGNOSIS — Z79899 Other long term (current) drug therapy: Secondary | ICD-10-CM

## 2011-09-22 DIAGNOSIS — Z113 Encounter for screening for infections with a predominantly sexual mode of transmission: Secondary | ICD-10-CM

## 2011-09-22 DIAGNOSIS — B20 Human immunodeficiency virus [HIV] disease: Secondary | ICD-10-CM

## 2011-09-22 LAB — CBC
Hemoglobin: 13 g/dL (ref 12.0–15.0)
MCH: 29.7 pg (ref 26.0–34.0)
MCHC: 34.4 g/dL (ref 30.0–36.0)
Platelets: 249 10*3/uL (ref 150–400)
RDW: 14.2 % (ref 11.5–15.5)

## 2011-09-22 LAB — LIPID PANEL
LDL Cholesterol: 95 mg/dL (ref 0–99)
Triglycerides: 76 mg/dL (ref <150)
VLDL: 15 mg/dL (ref 0–40)

## 2011-09-22 LAB — RPR

## 2011-09-22 LAB — COMPREHENSIVE METABOLIC PANEL
AST: 24 U/L (ref 0–37)
Alkaline Phosphatase: 73 U/L (ref 39–117)
Glucose, Bld: 68 mg/dL — ABNORMAL LOW (ref 70–99)
Potassium: 3.8 mEq/L (ref 3.5–5.3)
Sodium: 138 mEq/L (ref 135–145)
Total Bilirubin: 0.7 mg/dL (ref 0.3–1.2)
Total Protein: 8.1 g/dL (ref 6.0–8.3)

## 2011-09-22 IMAGING — CR DG CHEST 1V PORT
1 series · 1 of 1 positions shown · non-contrast
Comparison: 04/15/2010.

CLINICAL DATA: Sepsis.  Pneumonia.

PORTABLE CHEST - 1 VIEW

[view not recorded]
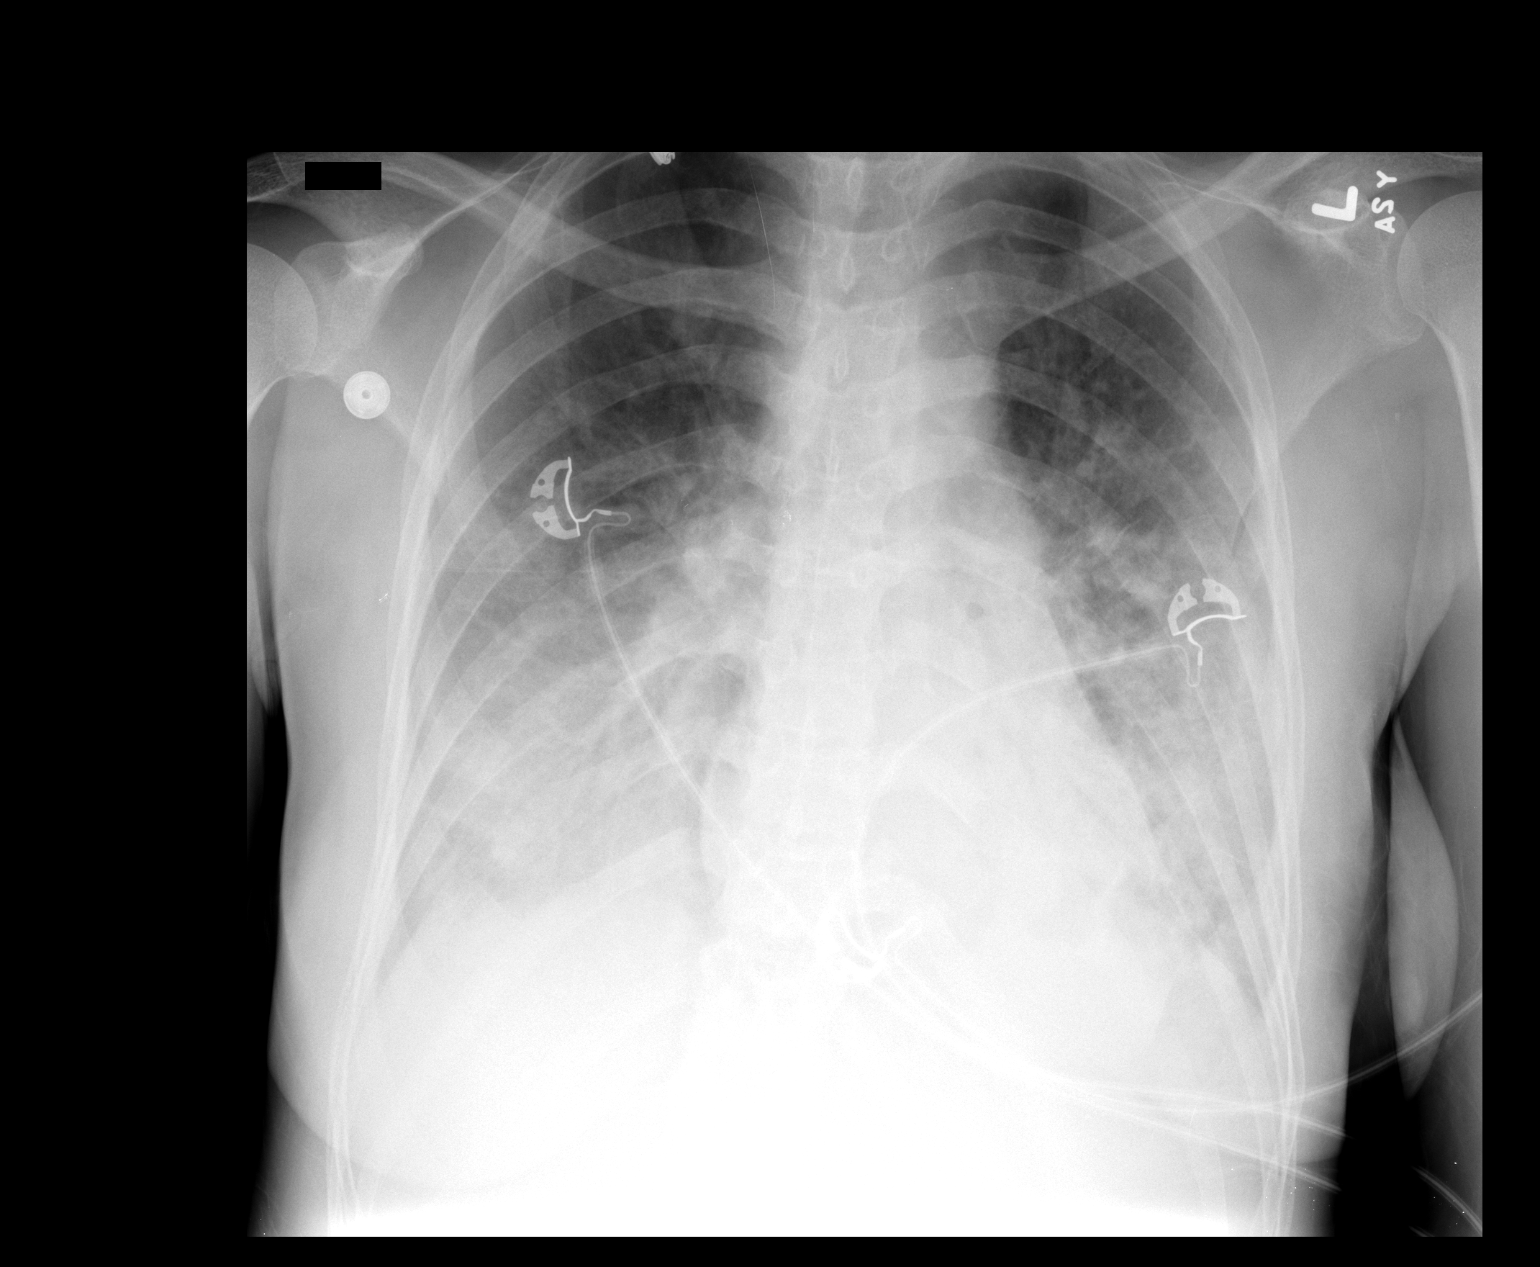

[1 of 1 positions shown; findings below may reference images not displayed]

FINDINGS: Marked change with development of prominent air space
disease most notable mid to lower lung zones which may represent
pulmonary edema and / or infiltrates.  No gross pneumothorax.
Heart size within normal limits.
IMPRESSION: Marked change with development of prominent air space disease most
notable mid to lower lung zones which may represent pulmonary edema
and / or infiltrates.

This is a call report.

## 2011-09-22 NOTE — Progress Notes (Signed)
Patient ID: Michelle Flores, female   DOB: 1970/12/19, 41 y.o.   MRN: [id number]  INFECTIOUS DISEASE PROGRESS NOTE    Subjective: Michelle Flores is in for her routine visit. She has not missed a single dose of her Truvada or Isentress. She is feeling better and hoping to find work soon. If she does not find work year or she may need to move out of state. She her female partner have been sexually active but always use condoms. He is HIV negative.  Objective:   General: She is in good spirits Skin: No rash Lungs: Clear  Cor: Regular S1 and S2 no murmurs    Lab Results Lab Results  Component Value Date   WBC 3.7* 12/17/2010   HGB 10.0* 12/17/2010   HCT 30.6* 12/17/2010   MCV 89.0 12/17/2010   PLT 301 12/17/2010    Lab Results  Component Value Date   CREATININE 0.78 12/17/2010   BUN 12 12/17/2010   NA 136 12/17/2010   K 3.8 12/17/2010   CL 101 12/17/2010   CO2 27 12/17/2010    Lab Results  Component Value Date   ALT 37* 12/17/2010   AST 42* 12/17/2010   ALKPHOS 155* 12/17/2010   BILITOT 0.6 12/17/2010      HIV 1 RNA Quant (copies/mL)  Date Value  05/22/2011 <20   12/17/2010 <20   08/05/2010 <20 copies/mL      CD4 T Cell Abs (cmm)  Date Value  05/22/2011 190*  12/17/2010 180*  08/05/2010 50*     Microbiology: No results found for this or any previous visit (from the past 240 hour(s)).  Studies/Results: No results found.   Assessment: Her infection remains under excellent control and her CD4 count is reconstituting steadily. I talked to her about the risk of transmission to her partner and although it is quite low with an undetectable CD4 count I suggested that he continue to use condoms. I will check another set of lab work today. She may be able. Mycobacterium avium therapy soon.  Plan: 1. Continue Truvada and Isentress in Mycobacterium avium therapy 2. Check lab work today 3. Return to clinic in 3 months 4. Will put her on the dental clinic waiting list   Cliffton Asters,  MD [facility] for Infectious Diseases [facility] [phone] pager   [phone] cell 09/22/2011, 11:40 AM

## 2011-09-23 LAB — T-HELPER CELL (CD4) - (RCID CLINIC ONLY): CD4 % Helper T Cell: 16 % — ABNORMAL LOW (ref 33–55)

## 2011-09-24 LAB — HIV-1 RNA QUANT-NO REFLEX-BLD
HIV 1 RNA Quant: <20 copies/mL (ref <20)
HIV-1 RNA Quant, Log: <1.3 {Log} (ref <1.30)

## 2011-10-07 ENCOUNTER — Telehealth: Payer: Self-pay | Admitting: *Deleted

## 2011-10-07 NOTE — Telephone Encounter (Signed)
Scheduled appt for Fri., Feb. 1, 2013 @ 0900.

## 2011-10-09 ENCOUNTER — Ambulatory Visit (INDEPENDENT_AMBULATORY_CARE_PROVIDER_SITE_OTHER): Payer: Self-pay | Admitting: *Deleted

## 2011-10-09 DIAGNOSIS — Z124 Encounter for screening for malignant neoplasm of cervix: Secondary | ICD-10-CM

## 2011-10-09 NOTE — Progress Notes (Signed)
  Subjective:     Michelle Flores is a 41 y.o. woman who comes in today for a  pap smear only.  Previous history of abnormal pap smears.  Contraception:  condoms   Objective:    LMP:  10/01/11 Pelvic Exam: . Pap smear obtained.   Assessment:    Screening pap smear.   Plan:    Follow up in one year, or as indicated by Pap results.  Pt given condoms.  Pt given educational materials re:  HIV and women, diet, exercise, nutrition, self-esteem, and BSE.

## 2011-10-09 NOTE — Patient Instructions (Signed)
Your results will be ready in about a week.  I will mail them to you.  Thank you for coming to the Center for your care. 

## 2011-10-15 ENCOUNTER — Encounter: Payer: Self-pay | Admitting: *Deleted

## 2011-12-08 ENCOUNTER — Other Ambulatory Visit (INDEPENDENT_AMBULATORY_CARE_PROVIDER_SITE_OTHER): Payer: Self-pay

## 2011-12-08 DIAGNOSIS — B20 Human immunodeficiency virus [HIV] disease: Secondary | ICD-10-CM

## 2011-12-08 DIAGNOSIS — Z113 Encounter for screening for infections with a predominantly sexual mode of transmission: Secondary | ICD-10-CM

## 2011-12-08 DIAGNOSIS — Z79899 Other long term (current) drug therapy: Secondary | ICD-10-CM

## 2011-12-08 LAB — CBC
HCT: 34.4 % — ABNORMAL LOW (ref 36.0–46.0)
MCV: 87.5 fL (ref 78.0–100.0)
Platelets: 232 10*3/uL (ref 150–400)
RBC: 3.93 MIL/uL (ref 3.87–5.11)
WBC: 4.5 10*3/uL (ref 4.0–10.5)

## 2011-12-08 LAB — COMPLETE METABOLIC PANEL WITH GFR
ALT: 15 U/L (ref 0–35)
Albumin: 4 g/dL (ref 3.5–5.2)
CO2: 27 mEq/L (ref 19–32)
Calcium: 9.5 mg/dL (ref 8.4–10.5)
Chloride: 105 mEq/L (ref 96–112)
Creat: 0.9 mg/dL (ref 0.50–1.10)
GFR, Est African American: >89 mL/min

## 2011-12-08 LAB — RPR

## 2011-12-08 LAB — LIPID PANEL
LDL Cholesterol: 80 mg/dL (ref 0–99)
Triglycerides: 84 mg/dL (ref <150)

## 2011-12-09 LAB — T-HELPER CELL (CD4) - (RCID CLINIC ONLY)
CD4 % Helper T Cell: 16 % — ABNORMAL LOW (ref 33–55)
CD4 T Cell Abs: 310 uL — ABNORMAL LOW (ref 400–2700)

## 2011-12-22 ENCOUNTER — Ambulatory Visit (INDEPENDENT_AMBULATORY_CARE_PROVIDER_SITE_OTHER): Payer: Self-pay | Admitting: Internal Medicine

## 2011-12-22 ENCOUNTER — Encounter: Payer: Self-pay | Admitting: Internal Medicine

## 2011-12-22 VITALS — BP 119/57 | HR 76 | Temp 98.1°F | Ht 66.0 in | Wt 167.8 lb

## 2011-12-22 DIAGNOSIS — B20 Human immunodeficiency virus [HIV] disease: Secondary | ICD-10-CM

## 2011-12-22 NOTE — Progress Notes (Signed)
Patient ID: Michelle Flores, female   DOB: 1971/06/28, 41 y.o.   MRN: [id number]  INFECTIOUS DISEASE PROGRESS NOTE    Subjective: Michelle Flores is in for her routine visit. She is doing well with the exception of been a little more short of breath recently. She notes this when she is walking briskly or trying to jog while training for a 5K run this weekend. She has had a little bit of problem with her seasonal allergies recently and has not been taking Claritin recently. She has not missed any doses of her Truvada or Isentress. She is still looking for work. Her children are doing well and making straight days.  Objective: Temp: 98.1 F (36.7 C) (04/16 0959) Temp src: Oral (04/16 0959) BP: 119/57 mmHg (04/16 0959) Pulse Rate: 76  (04/16 0959)  General: She's gained about 30 pounds since starting antiretroviral therapy. She is in good spirits Skin: No rash Lungs: Clear Cor: Regular S1 and S2 and no murmurs Abdomen: Soft and nontender   Lab Results HIV 1 RNA Quant (copies/mL)  Date Value  12/08/2011 <20   09/22/2011 <20   05/22/2011 <20      CD4 T Cell Abs (cmm)  Date Value  12/08/2011 310*  09/22/2011 310*  05/22/2011 190*     Assessment: Her HIV infection has come under excellent control over the last year and a half. I will continue her current antiretroviral regimen and stop her Mycobacterium avium therapy.  I suspect that her recent shortness of breath is related to her 30 pound weight gain and she is simply deconditioned. I encouraged her to continue her walking and jogging. I also suggested that she retry Claritin to see if some of the shortness of breath might be related to seasonal allergies.  Plan: 1. Continue Truvada and Isentress 2. Discontinue azithromycin and ethambutol 3. Restart Claritin 4. Return in 6 months   Cliffton Asters, MD [facility] for Infectious Diseases [facility] [phone] pager   [phone] cell 12/22/2011, 10:19 AM

## 2012-06-07 ENCOUNTER — Ambulatory Visit: Payer: Self-pay

## 2012-06-07 ENCOUNTER — Other Ambulatory Visit (INDEPENDENT_AMBULATORY_CARE_PROVIDER_SITE_OTHER): Payer: Self-pay

## 2012-06-07 DIAGNOSIS — B20 Human immunodeficiency virus [HIV] disease: Secondary | ICD-10-CM

## 2012-06-07 LAB — CBC
Hemoglobin: 11.7 g/dL — ABNORMAL LOW (ref 12.0–15.0)
MCHC: 34.8 g/dL (ref 30.0–36.0)
Platelets: 272 10*3/uL (ref 150–400)
RBC: 4.14 MIL/uL (ref 3.87–5.11)

## 2012-06-08 LAB — T-HELPER CELL (CD4) - (RCID CLINIC ONLY)
CD4 % Helper T Cell: 19 % — ABNORMAL LOW (ref 33–55)
CD4 T Cell Abs: 440 uL (ref 400–2700)

## 2012-06-08 LAB — HIV-1 RNA QUANT-NO REFLEX-BLD
HIV 1 RNA Quant: <20 copies/mL (ref <20)
HIV-1 RNA Quant, Log: <1.3 {Log} (ref <1.30)

## 2012-06-28 ENCOUNTER — Encounter: Payer: Self-pay | Admitting: Internal Medicine

## 2012-06-28 ENCOUNTER — Ambulatory Visit (INDEPENDENT_AMBULATORY_CARE_PROVIDER_SITE_OTHER): Payer: Self-pay | Admitting: Internal Medicine

## 2012-06-28 ENCOUNTER — Ambulatory Visit: Payer: Self-pay

## 2012-06-28 VITALS — BP 113/74 | HR 64 | Temp 97.9°F | Wt 180.0 lb

## 2012-06-28 DIAGNOSIS — B20 Human immunodeficiency virus [HIV] disease: Secondary | ICD-10-CM

## 2012-06-28 DIAGNOSIS — Z23 Encounter for immunization: Secondary | ICD-10-CM

## 2012-06-28 NOTE — Progress Notes (Signed)
Patient ID: Michelle Flores, female   DOB: [birth year], 41 y.o.   MRN: [id number]     [facility] for Infectious Disease  Patient Active Problem List  Diagnosis  . BACTEREMIA, MYCOBACTERIUM AVIUM COMPLEX  . HIV INFECTION  . ANEMIA OF OTHER CHRONIC DISEASE  . PSYCHOSIS  . PNEUMONIA, BILATERAL  . ADVERSE DRUG REACTION, SULFA  . Seasonal allergies    Patient's Medications  New Prescriptions   No medications on file  Previous Medications   BEE POLLEN 1000 MG CHEW    Chew by mouth.   CHOLECALCIFEROL (VITAMIN D) 1000 UNITS TABLET    Take 1,000 Units by mouth daily. Take 2 tablets by mouth daily.    EMTRICITABINE-TENOFOVIR (TRUVADA) 200-300 MG PER TABLET    Take 1 tablet by mouth daily.   MULTIPLE VITAMIN (MULTIVITAMIN) CAPSULE    Take 1 capsule by mouth daily.     NONI, MORINDA CITRIFOLIA, 400 MG CAPS    Take 400 mg by mouth 2 (two) times daily.     RALTEGRAVIR (ISENTRESS) 400 MG TABLET    Take 1 tablet (400 mg total) by mouth 2 (two) times daily.  Modified Medications   Modified Medication Previous Medication   LORATADINE (CLARITIN) 10 MG TABLET loratadine (CLARITIN) 10 MG tablet      Take 10 mg by mouth daily.    Take 1 tablet (10 mg total) by mouth daily.  Discontinued Medications   No medications on file    Subjective: Michelle Flores is in for her routine visit. As usual, she states that she never misses a single dose of her Truvada or Isentress. She has had a little bit of aching pain in her left heel since she injured it while running this past summer. She has been working a radical shifts as a Production designer, theatre/television/film at [employer] and has not been able to get regular exercise. She has been gaining weight. She is hoping that she can get a new job with regular hours so she can get back to exercising and lose some weight. 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  Objective: Temp: 97.9 F (36.6 C) (10/22 1054) Temp src: Oral (10/22 1054) BP: 113/74 mmHg (10/22 1054) Pulse Rate: 64  (10/22  1054)  General: Her weight is up. She is in good spirits. Skin: No rash Lungs: Clear Cor: Regular S1 and S2 no murmurs Left foot: Mild nonpitting edema. She has some tenderness laterally.  Lab Results HIV 1 RNA Quant (copies/mL)  Date Value  06/07/2012 <20   12/08/2011 <20   09/22/2011 <20      CD4 T Cell Abs (cmm)  Date Value  06/07/2012 440   12/08/2011 310*  09/22/2011 310*     Assessment: Her HIV infection remains under excellent control and she said steady CD4 reconstitution over the past 2 years. I will continue her current antiretroviral regimen. She probably has a mild chronic ankle sprain. I suggested she try acetaminophen or NSAIDs as needed.  Plan: 1. Continue Truvada and Isentress 2. Followup after lab work in 6 months   Cliffton Asters, MD [facility] for Infectious Disease [facility] [phone] pager   [phone] cell 06/28/2012, 11:32 AM

## 2012-06-30 ENCOUNTER — Other Ambulatory Visit: Payer: Self-pay | Admitting: *Deleted

## 2012-06-30 ENCOUNTER — Other Ambulatory Visit: Payer: Self-pay | Admitting: Internal Medicine

## 2012-06-30 DIAGNOSIS — B20 Human immunodeficiency virus [HIV] disease: Secondary | ICD-10-CM

## 2012-06-30 MED ORDER — EMTRICITABINE-TENOFOVIR DF 200-300 MG PO TABS: 1.0000 | ORAL_TABLET | Freq: Every day | ORAL | Status: DC

## 2012-06-30 MED ORDER — RALTEGRAVIR POTASSIUM 400 MG PO TABS: 400.0000 mg | ORAL_TABLET | Freq: Two times a day (BID) | ORAL | Status: DC

## 2012-09-12 ENCOUNTER — Telehealth: Payer: Self-pay | Admitting: *Deleted

## 2012-09-12 NOTE — Telephone Encounter (Signed)
Patient called and advised she has been sick since 09/06/12 with fever, chills, cough and body aches. She advised the fever broke 2 days ago and she feels a little better. But that the cough is in her chest and she feels like she did when she had pneumonia. She is able to get some mucus up and it is yellow. She wants to be seen because she has been ill for a long time and can not afford to be sick any longer. Gave her an appt with Dr Orvan Falconer for tomorrow 09/13/12 at 245 pm as she has an interview in the area and she did not want to come today she has no sitter.

## 2012-09-13 ENCOUNTER — Ambulatory Visit (INDEPENDENT_AMBULATORY_CARE_PROVIDER_SITE_OTHER): Payer: Self-pay | Admitting: Internal Medicine

## 2012-09-13 ENCOUNTER — Encounter: Payer: Self-pay | Admitting: Internal Medicine

## 2012-09-13 VITALS — BP 120/70 | HR 99 | Temp 97.9°F | Ht 66.0 in | Wt 174.2 lb

## 2012-09-13 DIAGNOSIS — J069 Acute upper respiratory infection, unspecified: Secondary | ICD-10-CM | POA: Insufficient documentation

## 2012-09-13 DIAGNOSIS — B20 Human immunodeficiency virus [HIV] disease: Secondary | ICD-10-CM

## 2012-09-13 MED ORDER — DM-GUAIFENESIN ER 30-600 MG PO TB12: 1.0000 | ORAL_TABLET | Freq: Two times a day (BID) | ORAL | Status: DC

## 2012-09-13 NOTE — Progress Notes (Signed)
Patient ID: Michelle Flores, female   DOB: [birth year], 42 y.o.   MRN: [id number]     [facility] for Infectious Disease  Patient Active Problem List  Diagnosis  . BACTEREMIA, MYCOBACTERIUM AVIUM COMPLEX  . HIV INFECTION  . ANEMIA OF OTHER CHRONIC DISEASE  . PSYCHOSIS  . PNEUMONIA, BILATERAL  . ADVERSE DRUG REACTION, SULFA  . Seasonal allergies  . Acute URI    Patient's Medications  New Prescriptions   DEXTROMETHORPHAN-GUAIFENESIN (MUCINEX DM) 30-600 MG PER 12 HR TABLET    Take 1 tablet by mouth every 12 (twelve) hours.  Previous Medications   BEE POLLEN 1000 MG CHEW    Chew by mouth.   CHOLECALCIFEROL (VITAMIN D) 1000 UNITS TABLET    Take 1,000 Units by mouth daily. Take 2 tablets by mouth daily.    EMTRICITABINE-TENOFOVIR (TRUVADA) 200-300 MG PER TABLET    Take 1 tablet by mouth daily.   LORATADINE (CLARITIN) 10 MG TABLET    Take 10 mg by mouth daily.   MULTIPLE VITAMIN (MULTIVITAMIN) CAPSULE    Take 1 capsule by mouth daily.     NONI, MORINDA CITRIFOLIA, 400 MG CAPS    Take 400 mg by mouth 2 (two) times daily.     RALTEGRAVIR (ISENTRESS) 400 MG TABLET    Take 1 tablet (400 mg total) by mouth 2 (two) times daily.  Modified Medications   No medications on file  Discontinued Medications   No medications on file    Subjective: Michelle Flores is in for her a work in visit. On New Year's Eve she began to develop some fever up to 101, diffuse muscle aches, and cough productive of some white sputum. She also felt quite fatigued. She's had several recent sick contacts. One of her daughters has a similar illness and was diagnosed with sinusitis yesterday. Another daughter has had recent gastroenteritis. Her fever and muscle aches have resolved but she has a persistent hacking cough and fullness in her ears. She has not missed any doses of her Truvada or Isentress. She has taken some over-the-counter, homeopathic medications for her respiratory infection. She is worried that she might be developing  pneumonia.  Objective: Temp: 97.9 F (36.6 C) (01/07 1437) Temp src: Oral (01/07 1437) BP: 120/70 mmHg (01/07 1437) Pulse Rate: 99  (01/07 1437)  General: She has a dry cough during exam and sounds congested Skin: No rash Oral: No oral pharyngeal lesions Ears: Clear with normal tympanic membranes Lungs: Clear Cor: Regular S1 and S2 no murmurs  Lab Results HIV 1 RNA Quant (copies/mL)  Date Value  06/07/2012 <20   12/08/2011 <20   09/22/2011 <20      CD4 T Cell Abs (cmm)  Date Value  06/07/2012 440   12/08/2011 310*  09/22/2011 310*     Assessment: Michelle Flores's HIV infection remains under excellent control. She's developed an acute respiratory infection. She received her influenza vaccine last fall but may still have had an influenza-like illness. She has no evidence of pneumonia and seems to be improving spontaneously. I suggested we try better cough suppression with over-the-counter agents.  Plan: 1. Add Mucinex as needed 2. Continue current antiretroviral therapy 3. Followup after lab work in April   Michelle Asters, MD [facility] for Infectious Disease [facility] [phone] pager   [phone] cell 09/13/2012, 3:01 PM

## 2012-10-10 ENCOUNTER — Ambulatory Visit: Payer: Self-pay

## 2012-11-28 ENCOUNTER — Ambulatory Visit: Payer: Self-pay

## 2012-11-29 ENCOUNTER — Telehealth: Payer: Self-pay

## 2012-11-29 NOTE — Telephone Encounter (Signed)
Patient came in 11/28/12 - did not have W2 for adap/rw and said would have by Wed - advised could not send w/o info - called today to remind patient.

## 2012-12-14 ENCOUNTER — Other Ambulatory Visit: Payer: Self-pay | Admitting: Infectious Diseases

## 2012-12-14 ENCOUNTER — Other Ambulatory Visit (INDEPENDENT_AMBULATORY_CARE_PROVIDER_SITE_OTHER): Payer: Self-pay

## 2012-12-14 DIAGNOSIS — Z113 Encounter for screening for infections with a predominantly sexual mode of transmission: Secondary | ICD-10-CM

## 2012-12-14 DIAGNOSIS — B20 Human immunodeficiency virus [HIV] disease: Secondary | ICD-10-CM

## 2012-12-14 DIAGNOSIS — Z79899 Other long term (current) drug therapy: Secondary | ICD-10-CM

## 2012-12-14 LAB — CBC
MCH: 27.5 pg (ref 26.0–34.0)
MCV: 77.2 fL — ABNORMAL LOW (ref 78.0–100.0)
Platelets: 276 10*3/uL (ref 150–400)
RDW: 14.2 % (ref 11.5–15.5)
WBC: 5.3 10*3/uL (ref 4.0–10.5)

## 2012-12-14 LAB — LIPID PANEL
Cholesterol: 127 mg/dL (ref 0–200)
VLDL: 14 mg/dL (ref 0–40)

## 2012-12-14 LAB — COMPLETE METABOLIC PANEL WITH GFR
AST: 26 U/L (ref 0–37)
Albumin: 4 g/dL (ref 3.5–5.2)
BUN: 13 mg/dL (ref 6–23)
Calcium: 9.9 mg/dL (ref 8.4–10.5)
Chloride: 106 mEq/L (ref 96–112)
Potassium: 4.1 mEq/L (ref 3.5–5.3)

## 2012-12-15 ENCOUNTER — Other Ambulatory Visit: Payer: Self-pay

## 2012-12-15 LAB — T-HELPER CELL (CD4) - (RCID CLINIC ONLY): CD4 T Cell Abs: 480 uL (ref 400–2700)

## 2012-12-26 ENCOUNTER — Encounter: Payer: Self-pay | Admitting: *Deleted

## 2012-12-26 ENCOUNTER — Telehealth: Payer: Self-pay | Admitting: *Deleted

## 2012-12-26 NOTE — Telephone Encounter (Signed)
Patient received ADAP approval letter, but noted it states at the bottom that her Truvada and Isentress are not covered under the medicaid formulary.  Patient confused as to whether or not she will be able to get her prescriptions.  RN advised her to call the [facility] specialty pharmacy, as they would have her ADAP information at hand and could let her know if it is covered.  Patient agreed, will call back here or let us know tomorrow at her appointment if there is any difficulty. Andree Coss, RN

## 2012-12-27 ENCOUNTER — Encounter: Payer: Self-pay | Admitting: Internal Medicine

## 2012-12-27 ENCOUNTER — Ambulatory Visit (INDEPENDENT_AMBULATORY_CARE_PROVIDER_SITE_OTHER): Payer: Self-pay | Admitting: Internal Medicine

## 2012-12-27 VITALS — BP 132/72 | HR 106 | Temp 97.4°F | Ht 66.0 in | Wt 166.5 lb

## 2012-12-27 DIAGNOSIS — B20 Human immunodeficiency virus [HIV] disease: Secondary | ICD-10-CM

## 2012-12-27 NOTE — Progress Notes (Signed)
Patient ID: Michelle Flores, female   DOB: 30-Jun-1971, 42 y.o.   MRN: [id number]          [facility] for Infectious Disease  Patient Active Problem List  Diagnosis  . BACTEREMIA, MYCOBACTERIUM AVIUM COMPLEX  . HIV INFECTION  . ANEMIA OF OTHER CHRONIC DISEASE  . PSYCHOSIS  . PNEUMONIA, BILATERAL  . ADVERSE DRUG REACTION, SULFA  . Seasonal allergies  . Acute URI    Patient's Medications  New Prescriptions   No medications on file  Previous Medications   BEE POLLEN 1000 MG CHEW    Chew by mouth.   CHOLECALCIFEROL (VITAMIN D) 1000 UNITS TABLET    Take 1,000 Units by mouth daily. Take 2 tablets by mouth daily.    DEXTROMETHORPHAN-GUAIFENESIN (MUCINEX DM) 30-600 MG PER 12 HR TABLET    Take 1 tablet by mouth every 12 (twelve) hours.   EMTRICITABINE-TENOFOVIR (TRUVADA) 200-300 MG PER TABLET    Take 1 tablet by mouth daily.   LORATADINE (CLARITIN) 10 MG TABLET    Take 10 mg by mouth daily.   MULTIPLE VITAMIN (MULTIVITAMIN) CAPSULE    Take 1 capsule by mouth daily.     NONI, MORINDA CITRIFOLIA, 400 MG CAPS    Take 400 mg by mouth 2 (two) times daily.     RALTEGRAVIR (ISENTRESS) 400 MG TABLET    Take 1 tablet (400 mg total) by mouth 2 (two) times daily.  Modified Medications   No medications on file  Discontinued Medications   No medications on file    Subjective: Michelle Flores is in for her routine visit. She denies missing any doses of her Truvada or Isentress since her last visit. She is feeling better over the past few years. 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 Her oldest daughter has sickle cell disease and has been having some problems recently and will have to undergo some surgery in the next few months.  Objective: Temp: 97.4 F (36.3 C) (04/22 1020) Temp src: Oral (04/22 1020) BP: 132/72 mmHg (04/22 1020) Pulse Rate: 106 (04/22 1020)  General: She is in very good spirits Skin: No rash Lungs: Clear Cor: Regular S1 and S2 with no  murmurs  Lab Results HIV 1 RNA Quant (copies/mL)  Date Value  12/14/2012 <20   06/07/2012 <20   12/08/2011 <20      CD4 T Cell Abs (cmm)  Date Value  12/14/2012 480   06/07/2012 440   12/08/2011 310*     Assessment: Her infection remains under excellent control and she's had dramatic CD4 reconstitution over the past 3 years. She was inquiring about the possibility of decreasing the dose of her HIV medications. I told her that she would need to stay on full dose of medications indefinitely but that there may be some possibility of simplifying her regimen. I will review all of her past regimens and blood work and see her back in one month. She got a letter recently about her ADAP renewal stating something about not approving her medications. Will have her meet with our financial counselor today.  Plan: 1. Check on ADAP renewal 2. Continue Truvada and Isentress for now 3. Followup in one month to talk about simplification of her antiretroviral regimen   Cliffton Asters, MD [facility] for Infectious Disease [facility] [phone] pager   [phone] cell 12/27/2012, 10:46 AM

## 2012-12-29 ENCOUNTER — Ambulatory Visit: Payer: Self-pay | Admitting: Internal Medicine

## 2013-01-02 NOTE — Progress Notes (Signed)
HPI: Michelle Flores is a 42 y.o. female who is here for her HIV follow up  Allergies: Allergies  Allergen Reactions  . Diphenhydramine Hcl   . Latex   . Sulfonamide Derivatives     Vitals:    Past Medical History: No past medical history on file.  Social History: History   Social History  . Marital Status: Divorced    Spouse Name: N/A    Number of Children: N/A  . Years of Education: N/A   Social History Main Topics  . Smoking status: Never Smoker   . Smokeless tobacco: Never Used  . Alcohol Use: No  . Drug Use: No  . Sexually Active: Yes     Comment: accepting condoms   Other Topics Concern  . Not on file   Social History Narrative  . No narrative on file     Current Regimen: Truvada + Isentress  Labs: HIV 1 RNA Quant (copies/mL)  Date Value  12/14/2012 <20   06/07/2012 <20   12/08/2011 <20      CD4 T Cell Abs (cmm)  Date Value  12/14/2012 480   06/07/2012 440   12/08/2011 310*     Hep B S Ab (no units)  Date Value  05/27/2010 POS*     Hepatitis B Surface Ag (no units)  Date Value  05/27/2010 NEG      HCV Ab (no units)  Date Value  05/27/2010 NEG     CrCl: Estimated Creatinine Clearance: 97.1 ml/min (by C-G formula based on Cr of 0.64).  Lipids:    Component Value Date/Time   CHOL 127 12/14/2012 1038   TRIG 69 12/14/2012 1038   HDL 39* 12/14/2012 1038   CHOLHDL 3.3 12/14/2012 1038   VLDL 14 12/14/2012 1038   LDLCALC 74 12/14/2012 1038    Assessment: Michelle Flores is doing very well on her current regimen. Dr. Orvan Falconer asked me about simplifying her regimen since she was asking about it. I reviewed her profile. Since she is doing so well on RAL, I see no reason why she wouldn't do well with Stribild. I think it would be a great alternative for her.  Recommendations: Start Stribild at the next follow up visit.  Clide Cliff, PharmD Clinical Infectious Disease Pharmacist [facility] for Infectious Disease 01/02/2013, 11:50 PM

## 2013-01-26 ENCOUNTER — Encounter: Payer: Self-pay | Admitting: Internal Medicine

## 2013-01-26 ENCOUNTER — Ambulatory Visit (INDEPENDENT_AMBULATORY_CARE_PROVIDER_SITE_OTHER): Payer: Self-pay | Admitting: Internal Medicine

## 2013-01-26 VITALS — BP 112/69 | HR 83 | Temp 97.7°F | Ht 66.0 in | Wt 166.0 lb

## 2013-01-26 DIAGNOSIS — B20 Human immunodeficiency virus [HIV] disease: Secondary | ICD-10-CM

## 2013-01-26 DIAGNOSIS — Z23 Encounter for immunization: Secondary | ICD-10-CM

## 2013-01-26 MED ORDER — ELVITEG-COBIC-EMTRICIT-TENOFDF 150-150-200-300 MG PO TABS: 1.0000 | ORAL_TABLET | Freq: Every day | ORAL | Status: DC

## 2013-01-26 NOTE — Progress Notes (Signed)
Patient ID: Michelle Flores, female   DOB: May 27, 1971, 42 y.o.   MRN: [id number]          [facility] for Infectious Disease  Patient Active Problem List   Diagnosis Date Noted  . HIV INFECTION 05/06/2010    Priority: High  . BACTEREMIA, MYCOBACTERIUM AVIUM COMPLEX 05/06/2010    Priority: Medium  . Seasonal allergies 12/17/2010  . PSYCHOSIS 05/06/2010  . ADVERSE DRUG REACTION, SULFA 05/01/2010  . ANEMIA OF OTHER CHRONIC DISEASE 04/25/2010  . PNEUMONIA, BILATERAL 04/16/2010    Patient's Medications  New Prescriptions   ELVITEGRAVIR-COBICISTAT-EMTRICITABINE-TENOFOVIR (STRIBILD) 150-150-200-300 MG TABS    Take 1 tablet by mouth daily with breakfast.  Previous Medications   BEE POLLEN 1000 MG CHEW    Chew by mouth.   CHOLECALCIFEROL (VITAMIN D) 1000 UNITS TABLET    Take 1,000 Units by mouth daily. Take 2 tablets by mouth daily.    DEXTROMETHORPHAN-GUAIFENESIN (MUCINEX DM) 30-600 MG PER 12 HR TABLET    Take 1 tablet by mouth every 12 (twelve) hours.   LORATADINE (CLARITIN) 10 MG TABLET    Take 10 mg by mouth daily.   MULTIPLE VITAMIN (MULTIVITAMIN) CAPSULE    Take 1 capsule by mouth daily.     NONI, MORINDA CITRIFOLIA, 400 MG CAPS    Take 400 mg by mouth 2 (two) times daily.    Modified Medications   No medications on file  Discontinued Medications   EMTRICITABINE-TENOFOVIR (TRUVADA) 200-300 MG PER TABLET    Take 1 tablet by mouth daily.   RALTEGRAVIR (ISENTRESS) 400 MG TABLET    Take 1 tablet (400 mg total) by mouth 2 (two) times daily.    Subjective: Michelle Flores is in for her routine followup visit. As usual she does not miss any doses of her Truvada or Isentress. She is feeling well with the exception of some concern about swelling of her legs recently. 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  Review of Systems: Constitutional: negative Ears, nose, mouth, throat, and face: negative Respiratory:  negative Cardiovascular: negative Gastrointestinal: negative Genitourinary:negative  No past medical history on file.  History  Substance Use Topics  . Smoking status: Never Smoker   . Smokeless tobacco: Never Used  . Alcohol Use: No    No family history on file.  Allergies  Allergen Reactions  . Diphenhydramine Hcl   . Latex   . Sulfonamide Derivatives     Objective: Temp: 97.7 F (36.5 C) (05/22 1005) Temp src: Oral (05/22 1005) BP: 112/69 mmHg (05/22 1005) Pulse Rate: 83 (05/22 1005)  General: She has lost weight from 180 pounds to 166. She is in good spirits as usual Oral: No oropharyngeal lesions Skin: No rash Lungs: Clear Cor: Regular S1 and S2 no murmurs Abdomen: Soft and nontender Joints and extremities: No edema or other abnormalities noted  Lab Results HIV 1 RNA Quant (copies/mL)  Date Value  12/14/2012 <20   06/07/2012 <20   12/08/2011 <20      CD4 T Cell Abs (cmm)  Date Value  12/14/2012 480   06/07/2012 440   12/08/2011 310*     Assessment: Her HIV infection remains under excellent control. I discussed the option of consolidating in simplifying her regimen to once daily Stribild and she agrees.  I do not see any evidence of lower extremity edema or unusual swelling. I encouraged her to arrange, regular physical exercise.  Plan: 1.  Change Truvada and Isentress to once daily Stribild 2. Followup after lab work in 3 months 3. Repeat pneumococcal vaccination today   Cliffton Asters, MD [facility] for Infectious Disease [facility] [phone] pager   [phone] cell 01/26/2013, 10:25 AM

## 2013-03-06 ENCOUNTER — Ambulatory Visit (INDEPENDENT_AMBULATORY_CARE_PROVIDER_SITE_OTHER): Payer: Self-pay | Admitting: *Deleted

## 2013-03-06 ENCOUNTER — Other Ambulatory Visit (HOSPITAL_COMMUNITY)
Admission: RE | Admit: 2013-03-06 | Discharge: 2013-03-06 | Disposition: A | Discharge status: Home / Self Care | Payer: Self-pay | Source: Ambulatory Visit | Attending: Internal Medicine | Admitting: Internal Medicine

## 2013-03-06 DIAGNOSIS — Z124 Encounter for screening for malignant neoplasm of cervix: Secondary | ICD-10-CM

## 2013-03-06 DIAGNOSIS — R600 Localized edema: Secondary | ICD-10-CM

## 2013-03-06 DIAGNOSIS — R609 Edema, unspecified: Secondary | ICD-10-CM

## 2013-03-06 DIAGNOSIS — Z1231 Encounter for screening mammogram for malignant neoplasm of breast: Secondary | ICD-10-CM

## 2013-03-06 DIAGNOSIS — Z113 Encounter for screening for infections with a predominantly sexual mode of transmission: Secondary | ICD-10-CM | POA: Insufficient documentation

## 2013-03-06 DIAGNOSIS — Z01419 Encounter for gynecological examination (general) (routine) without abnormal findings: Secondary | ICD-10-CM | POA: Insufficient documentation

## 2013-03-06 NOTE — Progress Notes (Signed)
  Subjective:     Michelle Flores is a 42 y.o. woman who comes in today for a  pap smear only.  Previous abnormal Pap smears: yes, many years ago. Contraception: condoms.  "I may have a yeast infection."  "I've been having swelling for about 4 weeks in both my lower legs.  It's a little better in the morning but it doesn't go totally away."  Objective:    There were no vitals taken for this visit. 1+ pitting edema noted on bilateral lower extremities. Pelvic Exam:  Pap smear obtained.   Assessment:    Screening pap smear.   Plan:    Follow up in year, or as indicated by Pap results.  Pt given educational materials re: HIV and diet, nutrition, exercise, BSE, health promotion, self-esteem and PAP smears.  Pt completed mammogram scholarship application for [facility] radiology.  Made appt w/ Dr. Luciana Axe for Wednesday morning to evaluate lower extremtity edema.

## 2013-03-06 NOTE — Patient Instructions (Signed)
  Your results will be available on MyChart in about a week.  Thank you for coming to the Center for your care.  [facility] will contact you to schedule an appointment after reviewing your scholarship application.

## 2013-03-08 ENCOUNTER — Telehealth: Payer: Self-pay

## 2013-03-08 ENCOUNTER — Encounter: Payer: Self-pay | Admitting: Internal Medicine

## 2013-03-08 ENCOUNTER — Ambulatory Visit (INDEPENDENT_AMBULATORY_CARE_PROVIDER_SITE_OTHER): Payer: Self-pay | Admitting: Internal Medicine

## 2013-03-08 ENCOUNTER — Encounter: Payer: Self-pay | Admitting: *Deleted

## 2013-03-08 VITALS — BP 121/80 | HR 86 | Temp 98.0°F | Ht 66.0 in | Wt 166.0 lb

## 2013-03-08 DIAGNOSIS — R609 Edema, unspecified: Secondary | ICD-10-CM

## 2013-03-08 LAB — COMPLETE METABOLIC PANEL WITH GFR
ALT: 20 U/L (ref 0–35)
Albumin: 4.2 g/dL (ref 3.5–5.2)
Alkaline Phosphatase: 151 U/L — ABNORMAL HIGH (ref 39–117)
CO2: 28 mEq/L (ref 19–32)
GFR, Est African American: >89 mL/min
GFR, Est Non African American: 85 mL/min
Glucose, Bld: 91 mg/dL (ref 70–99)
Potassium: 3.9 mEq/L (ref 3.5–5.3)
Sodium: 137 mEq/L (ref 135–145)
Total Protein: 7.6 g/dL (ref 6.0–8.3)

## 2013-03-08 NOTE — Assessment & Plan Note (Signed)
He has some edema though it is not very impressive. She does tell me that it is often times worse. I will check her creatinine and electrolytes as well as an echocardiogram to assure it isn't her heart though unlikely with this presentation. I doubt is related to her medications since it was present prior to changing to Stribild in the components other than the booster are essentially the same. If her echocardiogram and electrolytes are okay, I may give her a small dose of diuretic that she uses as needed I did tell her that there is no medical concern if those are all negative in regards to her edema and only needs to be treated symptomatically.

## 2013-03-08 NOTE — Telephone Encounter (Signed)
Patient not a [facility] resident, so not eligible for [employer].

## 2013-03-08 NOTE — Progress Notes (Signed)
  Subjective:    Patient ID: Michelle Flores, female    DOB: 1970-09-26, 42 y.o.   MRN: [id number]  HPI She comes in or a work in visit. She has a complaint of lower extremity edema. She has complained about this previously and was not significant. She though has noted increased edema over the last month. She also started working recently. She does not otherwise exercise. No chest pain, no shortness of breath.  She says her feet hurt when they get swollen. No fever or chills, no dysuria or increased urination though she does report frequency of about every 2 hours urinating   Review of Systems  Constitutional: Negative for fatigue and unexpected weight change.  Respiratory: Negative for cough and shortness of breath.   Cardiovascular: Positive for leg swelling. Negative for chest pain and palpitations.       No orthopnea  Gastrointestinal: Negative for nausea, abdominal pain and diarrhea.  Genitourinary: Negative for dysuria, hematuria, decreased urine volume and difficulty urinating.  Skin: Negative for rash.  Neurological: Negative for dizziness, light-headedness and headaches.       Objective:   Physical Exam  Constitutional: She appears well-developed and well-nourished. No distress.  HENT:  Mouth/Throat: Oropharynx is clear and moist. No oropharyngeal exudate.  Cardiovascular: Normal rate, regular rhythm and normal heart sounds.   No murmur heard. Pulmonary/Chest: Effort normal and breath sounds normal. No respiratory distress. She has no wheezes.  Musculoskeletal:  She has mild nonpitting edema with the left leg being slightly greater than the right  Lymphadenopathy:    She has no cervical adenopathy.  Skin: Skin is warm and dry. No rash noted.  Psychiatric: She has a normal mood and affect. Her behavior is normal.          Assessment & Plan:

## 2013-03-24 ENCOUNTER — Telehealth: Payer: Self-pay | Admitting: *Deleted

## 2013-03-24 NOTE — Telephone Encounter (Signed)
Referral made for patient to have echocardiogram at [facility] Tuesday, 7/22 at 9:30.  Pt verbalized agreement. Andree Coss, RN

## 2013-03-28 ENCOUNTER — Other Ambulatory Visit (HOSPITAL_COMMUNITY): Payer: Self-pay | Admitting: Internal Medicine

## 2013-03-28 ENCOUNTER — Ambulatory Visit (HOSPITAL_COMMUNITY)
Admission: RE | Admit: 2013-03-28 | Discharge: 2013-03-28 | Disposition: A | Discharge status: Home / Self Care | Payer: Self-pay | Source: Ambulatory Visit | Attending: Internal Medicine | Admitting: Internal Medicine

## 2013-03-28 DIAGNOSIS — R600 Localized edema: Secondary | ICD-10-CM

## 2013-03-28 DIAGNOSIS — Z8249 Family history of ischemic heart disease and other diseases of the circulatory system: Secondary | ICD-10-CM | POA: Insufficient documentation

## 2013-03-28 DIAGNOSIS — F29 Unspecified psychosis not due to a substance or known physiological condition: Secondary | ICD-10-CM | POA: Insufficient documentation

## 2013-03-28 DIAGNOSIS — D649 Anemia, unspecified: Secondary | ICD-10-CM | POA: Insufficient documentation

## 2013-03-28 DIAGNOSIS — R609 Edema, unspecified: Secondary | ICD-10-CM | POA: Insufficient documentation

## 2013-03-28 DIAGNOSIS — I509 Heart failure, unspecified: Secondary | ICD-10-CM | POA: Insufficient documentation

## 2013-03-28 DIAGNOSIS — Z21 Asymptomatic human immunodeficiency virus [HIV] infection status: Secondary | ICD-10-CM | POA: Insufficient documentation

## 2013-03-28 NOTE — Progress Notes (Signed)
  Echocardiogram 2D Echocardiogram has been performed.  Cathie Beams 03/28/2013, 11:44 AM

## 2013-04-07 ENCOUNTER — Ambulatory Visit (HOSPITAL_COMMUNITY): Payer: Self-pay

## 2013-04-14 ENCOUNTER — Ambulatory Visit (HOSPITAL_COMMUNITY): Payer: Self-pay

## 2013-04-17 ENCOUNTER — Other Ambulatory Visit (INDEPENDENT_AMBULATORY_CARE_PROVIDER_SITE_OTHER): Payer: Self-pay

## 2013-04-17 DIAGNOSIS — B20 Human immunodeficiency virus [HIV] disease: Secondary | ICD-10-CM

## 2013-04-18 LAB — HIV-1 RNA QUANT-NO REFLEX-BLD: HIV 1 RNA Quant: <20 copies/mL (ref <20)

## 2013-04-18 LAB — T-HELPER CELL (CD4) - (RCID CLINIC ONLY): CD4 % Helper T Cell: 24 % — ABNORMAL LOW (ref 33–55)

## 2013-04-24 ENCOUNTER — Ambulatory Visit (HOSPITAL_COMMUNITY): Payer: Self-pay

## 2013-05-01 ENCOUNTER — Ambulatory Visit: Payer: Self-pay | Admitting: Internal Medicine

## 2013-05-01 ENCOUNTER — Ambulatory Visit: Payer: Self-pay

## 2013-05-02 ENCOUNTER — Ambulatory Visit (INDEPENDENT_AMBULATORY_CARE_PROVIDER_SITE_OTHER): Payer: Self-pay | Admitting: Internal Medicine

## 2013-05-02 ENCOUNTER — Ambulatory Visit (HOSPITAL_COMMUNITY)
Admission: RE | Admit: 2013-05-02 | Discharge: 2013-05-02 | Disposition: A | Discharge status: Home / Self Care | Payer: Self-pay | Source: Ambulatory Visit | Attending: Internal Medicine | Admitting: Internal Medicine

## 2013-05-02 ENCOUNTER — Ambulatory Visit: Payer: Self-pay

## 2013-05-02 ENCOUNTER — Ambulatory Visit: Payer: Self-pay | Admitting: Internal Medicine

## 2013-05-02 VITALS — BP 106/66 | HR 77 | Temp 97.8°F | Wt 166.0 lb

## 2013-05-02 DIAGNOSIS — Z1231 Encounter for screening mammogram for malignant neoplasm of breast: Secondary | ICD-10-CM

## 2013-05-02 DIAGNOSIS — Z23 Encounter for immunization: Secondary | ICD-10-CM

## 2013-05-02 DIAGNOSIS — B20 Human immunodeficiency virus [HIV] disease: Secondary | ICD-10-CM

## 2013-05-02 NOTE — Progress Notes (Signed)
Patient ID: Michelle Flores, female   DOB: 23-Dec-1970, 42 y.o.   MRN: [id number]          [facility] for Infectious Disease  Patient Active Problem List   Diagnosis Date Noted  . HIV INFECTION 05/06/2010    Priority: High  . BACTEREMIA, MYCOBACTERIUM AVIUM COMPLEX 05/06/2010    Priority: Medium  . Edema 03/08/2013  . Seasonal allergies 12/17/2010  . PSYCHOSIS 05/06/2010  . ADVERSE DRUG REACTION, SULFA 05/01/2010  . ANEMIA OF OTHER CHRONIC DISEASE 04/25/2010  . PNEUMONIA, BILATERAL 04/16/2010    Patient's Medications  New Prescriptions   No medications on file  Previous Medications   BEE POLLEN 1000 MG CHEW    Chew by mouth.   CHOLECALCIFEROL (VITAMIN D) 1000 UNITS TABLET    Take 1,000 Units by mouth daily. Take 2 tablets by mouth daily.    DEXTROMETHORPHAN-GUAIFENESIN (MUCINEX DM) 30-600 MG PER 12 HR TABLET    Take 1 tablet by mouth every 12 (twelve) hours.   ELVITEGRAVIR-COBICISTAT-EMTRICITABINE-TENOFOVIR (STRIBILD) 150-150-200-300 MG TABS    Take 1 tablet by mouth daily with breakfast.   LORATADINE (CLARITIN) 10 MG TABLET    Take 10 mg by mouth daily.   MULTIPLE VITAMIN (MULTIVITAMIN) CAPSULE    Take 1 capsule by mouth daily.     NONI, MORINDA CITRIFOLIA, 400 MG CAPS    Take 400 mg by mouth 2 (two) times daily. "[place]"  Modified Medications   No medications on file  Discontinued Medications   No medications on file    Subjective: [name]  Is in for her routine visit. She finds it easier to take 1 Stribild at bedtime and her previous 3 pill regimen. She has not missed any doses. Her 2 daughters have returned to school yesterday and she is enjoying working full-time. She has reconnected with a former boyfriend and they have had discussions about possible marriage. They have not been sexually active. He is fully aware of her HIV infection and very supportive. He is currently working and living in [place]. She will be seeing him next month. Review of Systems: Pertinent items  are noted in HPI.  No past medical history on file.  History  Substance Use Topics  . Smoking status: Never Smoker   . Smokeless tobacco: Never Used  . Alcohol Use: No    No family history on file.  Allergies  Allergen Reactions  . Diphenhydramine Hcl   . Latex   . Sulfonamide Derivatives     Objective: Temp: 97.8 F (36.6 C) (08/26 1027) Temp src: Oral (08/26 1027) BP: 106/66 mmHg (08/26 1027) Pulse Rate: 77 (08/26 1027)  General: She is in very good spirits Oral: No oropharyngeal lesions Skin: No rash Lungs: Clear Cor: Regular S1 and S2 no murmurs Mood and affect: Normal  Lab Results HIV 1 RNA Quant (copies/mL)  Date Value  04/17/2013 <20   12/14/2012 <20   06/07/2012 <20      CD4 T Cell Abs (cmm)  Date Value  04/17/2013 520   12/14/2012 480   06/07/2012 440      Assessment: Her HIV infection remains under excellent control. She has had complete his CD4 reconstitution from less than 10 to completely normal over the past 3 years. I long talk with her about the implications of sexual activity between discordant couples. The risk of her transmitting HIV infection to a partner his very low because of her undetectable status but I suggested that they discuss using condoms if they do  become sexually active to protect both of them.  Plan: 1. Continue Stribild 2. Followup after lab work in 6 months   Cliffton Asters, MD [facility] for Infectious Disease [facility] [phone] pager   [phone] cell 05/02/2013, 10:49 AM

## 2013-05-18 ENCOUNTER — Telehealth: Payer: Self-pay

## 2013-05-18 NOTE — Telephone Encounter (Signed)
Patient has 2 jobs but has had issues getting paystubs from one employer - called and left messages to check on status - may need to do income signature card if employer will not document income - called and left messages for patient to get back as soon as possible

## 2013-06-07 ENCOUNTER — Telehealth: Payer: Self-pay

## 2013-06-07 NOTE — Telephone Encounter (Signed)
Patient came in to recertify 05/02/13 - she is working 2 jobs and needed to get paystubs or employment letter showing gross and net income for job at [employer] job - said she had to print out online - she stated she would send her password and id to me to try from here - Called several times and left messages and did not hear back.

## 2013-06-14 ENCOUNTER — Other Ambulatory Visit: Payer: Self-pay | Admitting: *Deleted

## 2013-06-14 DIAGNOSIS — B20 Human immunodeficiency virus [HIV] disease: Secondary | ICD-10-CM

## 2013-06-14 MED ORDER — ELVITEG-COBIC-EMTRICIT-TENOFDF 150-150-200-300 MG PO TABS: 1.0000 | ORAL_TABLET | Freq: Every day | ORAL | Status: DC

## 2013-07-04 ENCOUNTER — Telehealth: Payer: Self-pay | Admitting: *Deleted

## 2013-07-04 NOTE — Telephone Encounter (Signed)
Ms. Whitenack had called Tamika about needing her Stribild.  I called [facility] and got her an emergency 30 day supply.  I gave Ms. Fugitt the information she needed to go to her pharmacy and get her medication.

## 2013-08-22 ENCOUNTER — Ambulatory Visit: Payer: Self-pay | Admitting: Internal Medicine

## 2013-10-24 ENCOUNTER — Other Ambulatory Visit: Payer: Self-pay

## 2013-11-07 ENCOUNTER — Ambulatory Visit: Payer: Self-pay | Admitting: Internal Medicine

## 2013-11-21 ENCOUNTER — Ambulatory Visit: Payer: Self-pay

## 2013-12-20 ENCOUNTER — Ambulatory Visit: Payer: Self-pay

## 2013-12-20 ENCOUNTER — Other Ambulatory Visit: Payer: Self-pay

## 2014-01-02 ENCOUNTER — Ambulatory Visit (INDEPENDENT_AMBULATORY_CARE_PROVIDER_SITE_OTHER): Payer: Self-pay | Admitting: Internal Medicine

## 2014-01-02 ENCOUNTER — Encounter: Payer: Self-pay | Admitting: Internal Medicine

## 2014-01-02 VITALS — BP 94/60 | HR 122 | Temp 98.0°F | Wt 124.5 lb

## 2014-01-02 DIAGNOSIS — Z79899 Other long term (current) drug therapy: Secondary | ICD-10-CM

## 2014-01-02 DIAGNOSIS — Z113 Encounter for screening for infections with a predominantly sexual mode of transmission: Secondary | ICD-10-CM

## 2014-01-02 DIAGNOSIS — B20 Human immunodeficiency virus [HIV] disease: Secondary | ICD-10-CM

## 2014-01-02 LAB — CBC
HEMATOCRIT: 26.8 % — AB (ref 36.0–46.0)
Hemoglobin: 9.2 g/dL — ABNORMAL LOW (ref 12.0–15.0)
MCH: 25.3 pg — ABNORMAL LOW (ref 26.0–34.0)
MCHC: 34.3 g/dL (ref 30.0–36.0)
MCV: 73.6 fL — ABNORMAL LOW (ref 78.0–100.0)
Platelets: 146 10*3/uL — ABNORMAL LOW (ref 150–400)
RBC: 3.64 MIL/uL — ABNORMAL LOW (ref 3.87–5.11)
RDW: 15.1 % (ref 11.5–15.5)
WBC: 3.7 10*3/uL — ABNORMAL LOW (ref 4.0–10.5)

## 2014-01-02 LAB — COMPREHENSIVE METABOLIC PANEL
ALBUMIN: 3 g/dL — AB (ref 3.5–5.2)
ALK PHOS: 155 U/L — AB (ref 39–117)
ALT: 21 U/L (ref 0–35)
AST: 54 U/L — AB (ref 0–37)
BUN: 6 mg/dL (ref 6–23)
CO2: 26 mEq/L (ref 19–32)
Calcium: 8.6 mg/dL (ref 8.4–10.5)
Chloride: 103 mEq/L (ref 96–112)
Creat: 0.47 mg/dL — ABNORMAL LOW (ref 0.50–1.10)
Glucose, Bld: 75 mg/dL (ref 70–99)
POTASSIUM: 3.3 meq/L — AB (ref 3.5–5.3)
SODIUM: 136 meq/L (ref 135–145)
Total Bilirubin: 1.4 mg/dL — ABNORMAL HIGH (ref 0.2–1.2)
Total Protein: 7.7 g/dL (ref 6.0–8.3)

## 2014-01-02 LAB — LIPID PANEL
CHOL/HDL RATIO: 4.1 ratio
Cholesterol: 74 mg/dL (ref 0–200)
HDL: 18 mg/dL — AB (ref >39)
LDL CALC: 41 mg/dL (ref 0–99)
TRIGLYCERIDES: 74 mg/dL (ref <150)
VLDL: 15 mg/dL (ref 0–40)
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
## 2014-01-03 LAB — T-HELPER CELL (CD4) - (RCID CLINIC ONLY)
CD4 T CELL ABS: 100 /uL — AB (ref 400–2700)
CD4 T CELL HELPER: 11 % — AB (ref 33–55)

## 2014-01-03 LAB — HIV-1 RNA QUANT-NO REFLEX-BLD
HIV 1 RNA QUANT: 191968 {copies}/mL — AB (ref <20)
HIV-1 RNA QUANT, LOG: 5.28 {Log} — AB (ref <1.30)

## 2014-01-03 LAB — RPR

## 2014-01-09 LAB — HIV-1 INTEGRASE GENOTYPE

## 2014-01-10 LAB — HLA B*5701: HLA-B 5701 W/RFLX HLA-B HIGH: NEGATIVE

## 2014-01-15 LAB — HIV-1 GENOTYPR PLUS

## 2014-01-30 ENCOUNTER — Ambulatory Visit: Payer: Self-pay | Admitting: Internal Medicine

## 2014-02-06 ENCOUNTER — Telehealth: Payer: Self-pay | Admitting: *Deleted

## 2014-02-06 ENCOUNTER — Encounter: Payer: Self-pay | Admitting: Internal Medicine

## 2014-02-06 ENCOUNTER — Other Ambulatory Visit: Payer: Self-pay | Admitting: *Deleted

## 2014-02-06 ENCOUNTER — Ambulatory Visit (INDEPENDENT_AMBULATORY_CARE_PROVIDER_SITE_OTHER): Payer: Self-pay | Admitting: Internal Medicine

## 2014-02-06 VITALS — BP 103/67 | HR 170 | Temp 97.6°F | Wt 112.0 lb

## 2014-02-06 DIAGNOSIS — B20 Human immunodeficiency virus [HIV] disease: Secondary | ICD-10-CM

## 2014-02-06 MED ORDER — ELVITEG-COBIC-EMTRICIT-TENOFDF 150-150-200-300 MG PO TABS: 1.0000 | ORAL_TABLET | Freq: Every day | ORAL | Status: DC

## 2014-02-06 MED ORDER — ELVITEG-COBIC-EMTRICIT-TENOFDF 150-150-200-300 MG PO TABS: 1.0000 | ORAL_TABLET | Freq: Every day | ORAL | Status: AC

## 2014-02-06 MED ORDER — DAPSONE 100 MG PO TABS: 100.0000 mg | ORAL_TABLET | Freq: Every day | ORAL | Status: DC

## 2014-02-06 NOTE — Progress Notes (Signed)
Patient ID: Michelle CurtisOdella P Flores, female   DOB: 06/12/1971, 43 y.o.   MRN: [id number]          Patient Active Problem List   Diagnosis Date Noted  . HIV INFECTION 05/06/2010    Priority: High  . BACTEREMIA, MYCOBACTERIUM AVIUM COMPLEX 05/06/2010    Priority: Medium  . Edema 03/08/2013  . Seasonal allergies 12/17/2010  . PSYCHOSIS 05/06/2010  . ADVERSE DRUG REACTION, SULFA 05/01/2010  . ANEMIA OF OTHER CHRONIC DISEASE 04/25/2010  . PNEUMONIA, BILATERAL 04/16/2010    Patient's Medications  New Prescriptions   DAPSONE 100 MG TABLET    Take 1 tablet (100 mg total) by mouth daily.  Previous Medications   BEE POLLEN 1000 MG CHEW    Chew by mouth.   CHOLECALCIFEROL (VITAMIN D) 1000 UNITS TABLET    Take 1,000 Units by mouth daily. Take 2 tablets by mouth daily.    CYCLOBENZAPRINE (FLEXERIL) 10 MG TABLET    Take 10 mg by mouth 3 (three) times daily as needed for muscle spasms.   DEXTROMETHORPHAN-GUAIFENESIN (MUCINEX DM) 30-600 MG PER 12 HR TABLET    Take 1 tablet by mouth every 12 (twelve) hours.   ELVITEGRAVIR-COBICISTAT-EMTRICITABINE-TENOFOVIR (STRIBILD) 150-150-200-300 MG TABS TABLET    Take 1 tablet by mouth daily with breakfast.   LORATADINE (CLARITIN) 10 MG TABLET    Take 10 mg by mouth daily.   MULTIPLE VITAMIN (MULTIVITAMIN) CAPSULE    Take 1 capsule by mouth daily.     NAPROXEN (NAPROSYN) 250 MG TABLET    Take 250 mg by mouth 2 (two) times daily with a meal.   NONI, MORINDA CITRIFOLIA, 400 MG CAPS    Take 400 mg by mouth 2 (two) times daily. "Moringa"  Modified Medications   No medications on file  Discontinued Medications   No medications on file    Subjective: Michelle Flores is in for her followup visit. She has not been able to restart Stribild yet. She is still struggling to get her paced up from her employer. She is feeling weaker. Review of Systems: Pertinent items are noted in HPI.  No past medical history on file.  History  Substance Use Topics  . Smoking status: Never Smoker    . Smokeless tobacco: Never Used  . Alcohol Use: No    No family history on file.  Allergies  Allergen Reactions  . Diphenhydramine Hcl   . Latex   . Sulfonamide Derivatives     Objective: Temp: 97.6 F (36.4 C) (06/02 1008) Temp src: Oral (06/02 1008) BP: 103/67 mmHg (06/02 1008) Pulse Rate: 170 (06/02 1008) Body mass index is 18.09 kg/(m^2).  General: Her weight continues to drop Oral: No oropharyngeal lesions Skin: No rash Lungs: Clear Cor: Regular S1 and S2 with no murmurs  Lab Results Lab Results  Component Value Date   WBC 3.7* 01/02/2014   HGB 9.2* 01/02/2014   HCT 26.8* 01/02/2014   MCV 73.6* 01/02/2014   PLT 146* 01/02/2014    Lab Results  Component Value Date   CREATININE 0.47* 01/02/2014   BUN 6 01/02/2014   NA 136 01/02/2014   K 3.3* 01/02/2014   CL 103 01/02/2014   CO2 26 01/02/2014    Lab Results  Component Value Date   ALT 21 01/02/2014   AST 54* 01/02/2014   ALKPHOS 155* 01/02/2014   BILITOT 1.4* 01/02/2014    Lab Results  Component Value Date   CHOL 74 01/02/2014   HDL 18* 01/02/2014   LDLCALC 41  01/02/2014   TRIG 74 01/02/2014   CHOLHDL 4.1 01/02/2014    Lab Results HIV 1 RNA Quant (copies/mL)  Date Value  01/02/2014 201007*  04/17/2013 <20   12/14/2012 <20      CD4 T Cell Abs (/uL)  Date Value  01/02/2014 100*  04/17/2013 520   12/14/2012 480      Assessment: Her HIV infection has gone out of control and she has had a rapid and dramatic decline in CD4 count since stopping Stribild earlier this year. She will continue to work with our Artist to try to get her recertified for her medication. I will start dapsone prophylaxis.  Plan: 1. Start dapsone 2. Restart Stribild ASAP 3. Followup in 4 weeks   Cliffton Asters, MD [facility] [phone] pager   [phone] cell 02/06/2014, 10:29 AM

## 2014-02-06 NOTE — Telephone Encounter (Signed)
I met with Michelle Flores today.  She needed help getting her Stribild.  I called Philis Fendt and was told she had already received the emergency supply of Stribild.  I told [name] her CD4 count was really low and needed to get her medication as soon as possible.  [name] told me to have a co-pay card activated for her because it can be used for Stribild or Complera by patients without insurance.  I called and activated the card and gave it to [name].  She is going by the pharmacy to get her meds today.

## 2014-02-06 NOTE — Addendum Note (Signed)
Addended by: Jennet Maduro D on: 02/06/2014 10:38 AM   Modules accepted: Orders

## 2014-02-18 ENCOUNTER — Inpatient Hospital Stay (HOSPITAL_COMMUNITY): Payer: Medicaid Other

## 2014-02-18 ENCOUNTER — Inpatient Hospital Stay (HOSPITAL_COMMUNITY)
Admission: AD | Admit: 2014-02-18 | Discharge: 2014-03-07 | DRG: 237 | Disposition: E | Payer: Medicaid Other | Source: Other Acute Inpatient Hospital | Attending: Pulmonary Disease | Admitting: Pulmonary Disease

## 2014-02-18 DIAGNOSIS — R17 Unspecified jaundice: Secondary | ICD-10-CM | POA: Diagnosis present

## 2014-02-18 DIAGNOSIS — I959 Hypotension, unspecified: Secondary | ICD-10-CM | POA: Diagnosis present

## 2014-02-18 DIAGNOSIS — K7200 Acute and subacute hepatic failure without coma: Secondary | ICD-10-CM | POA: Diagnosis present

## 2014-02-18 DIAGNOSIS — D649 Anemia, unspecified: Secondary | ICD-10-CM | POA: Diagnosis present

## 2014-02-18 DIAGNOSIS — E872 Acidosis, unspecified: Secondary | ICD-10-CM

## 2014-02-18 DIAGNOSIS — K8309 Other cholangitis: Secondary | ICD-10-CM | POA: Diagnosis present

## 2014-02-18 DIAGNOSIS — I509 Heart failure, unspecified: Secondary | ICD-10-CM | POA: Diagnosis present

## 2014-02-18 DIAGNOSIS — G9340 Encephalopathy, unspecified: Secondary | ICD-10-CM | POA: Diagnosis present

## 2014-02-18 DIAGNOSIS — D696 Thrombocytopenia, unspecified: Secondary | ICD-10-CM | POA: Diagnosis present

## 2014-02-18 DIAGNOSIS — N179 Acute kidney failure, unspecified: Secondary | ICD-10-CM | POA: Diagnosis present

## 2014-02-18 DIAGNOSIS — I469 Cardiac arrest, cause unspecified: Secondary | ICD-10-CM

## 2014-02-18 DIAGNOSIS — Z515 Encounter for palliative care: Secondary | ICD-10-CM

## 2014-02-18 DIAGNOSIS — E0591 Thyrotoxicosis, unspecified with thyrotoxic crisis or storm: Secondary | ICD-10-CM | POA: Diagnosis present

## 2014-02-18 DIAGNOSIS — D684 Acquired coagulation factor deficiency: Secondary | ICD-10-CM | POA: Diagnosis present

## 2014-02-18 DIAGNOSIS — IMO0002 Reserved for concepts with insufficient information to code with codable children: Secondary | ICD-10-CM

## 2014-02-18 DIAGNOSIS — I428 Other cardiomyopathies: Secondary | ICD-10-CM | POA: Diagnosis present

## 2014-02-18 DIAGNOSIS — I472 Ventricular tachycardia, unspecified: Secondary | ICD-10-CM | POA: Diagnosis not present

## 2014-02-18 DIAGNOSIS — K819 Cholecystitis, unspecified: Secondary | ICD-10-CM | POA: Diagnosis present

## 2014-02-18 DIAGNOSIS — G931 Anoxic brain damage, not elsewhere classified: Secondary | ICD-10-CM | POA: Diagnosis present

## 2014-02-18 DIAGNOSIS — Z66 Do not resuscitate: Secondary | ICD-10-CM | POA: Diagnosis not present

## 2014-02-18 DIAGNOSIS — J13 Pneumonia due to Streptococcus pneumoniae: Secondary | ICD-10-CM | POA: Diagnosis present

## 2014-02-18 DIAGNOSIS — E874 Mixed disorder of acid-base balance: Secondary | ICD-10-CM | POA: Diagnosis present

## 2014-02-18 DIAGNOSIS — Z79899 Other long term (current) drug therapy: Secondary | ICD-10-CM | POA: Diagnosis not present

## 2014-02-18 DIAGNOSIS — E43 Unspecified severe protein-calorie malnutrition: Secondary | ICD-10-CM | POA: Diagnosis present

## 2014-02-18 DIAGNOSIS — E876 Hypokalemia: Secondary | ICD-10-CM | POA: Diagnosis not present

## 2014-02-18 DIAGNOSIS — I5021 Acute systolic (congestive) heart failure: Secondary | ICD-10-CM | POA: Diagnosis present

## 2014-02-18 DIAGNOSIS — R4020 Unspecified coma: Secondary | ICD-10-CM | POA: Diagnosis present

## 2014-02-18 DIAGNOSIS — K5289 Other specified noninfective gastroenteritis and colitis: Secondary | ICD-10-CM | POA: Diagnosis present

## 2014-02-18 DIAGNOSIS — B20 Human immunodeficiency virus [HIV] disease: Secondary | ICD-10-CM | POA: Diagnosis present

## 2014-02-18 DIAGNOSIS — R64 Cachexia: Secondary | ICD-10-CM | POA: Diagnosis present

## 2014-02-18 DIAGNOSIS — I4729 Other ventricular tachycardia: Secondary | ICD-10-CM | POA: Diagnosis not present

## 2014-02-18 DIAGNOSIS — I429 Cardiomyopathy, unspecified: Secondary | ICD-10-CM

## 2014-02-18 DIAGNOSIS — R579 Shock, unspecified: Secondary | ICD-10-CM

## 2014-02-18 DIAGNOSIS — J9601 Acute respiratory failure with hypoxia: Secondary | ICD-10-CM

## 2014-02-18 DIAGNOSIS — R570 Cardiogenic shock: Secondary | ICD-10-CM

## 2014-02-18 DIAGNOSIS — R634 Abnormal weight loss: Secondary | ICD-10-CM | POA: Diagnosis present

## 2014-02-18 DIAGNOSIS — J9600 Acute respiratory failure, unspecified whether with hypoxia or hypercapnia: Secondary | ICD-10-CM | POA: Diagnosis present

## 2014-02-18 DIAGNOSIS — R112 Nausea with vomiting, unspecified: Secondary | ICD-10-CM | POA: Diagnosis present

## 2014-02-18 LAB — CBC WITH DIFFERENTIAL/PLATELET
BASOS ABS: 0.1 10*3/uL (ref 0.0–0.1)
BASOS PCT: 1 % (ref 0–1)
EOS ABS: 0 10*3/uL (ref 0.0–0.7)
Eosinophils Relative: 0 % (ref 0–5)
HCT: 33.9 % — ABNORMAL LOW (ref 36.0–46.0)
Hemoglobin: 11.3 g/dL — ABNORMAL LOW (ref 12.0–15.0)
Lymphocytes Relative: 7 % — ABNORMAL LOW (ref 12–46)
Lymphs Abs: 0.9 10*3/uL (ref 0.7–4.0)
MCH: 29.3 pg (ref 26.0–34.0)
MCHC: 33.3 g/dL (ref 30.0–36.0)
MCV: 87.8 fL (ref 78.0–100.0)
MONO ABS: 0.8 10*3/uL (ref 0.1–1.0)
Monocytes Relative: 6 % (ref 3–12)
NEUTROS PCT: 86 % — AB (ref 43–77)
Neutro Abs: 11 10*3/uL — ABNORMAL HIGH (ref 1.7–7.7)
Platelets: 121 10*3/uL — ABNORMAL LOW (ref 150–400)
RBC: 3.86 MIL/uL — ABNORMAL LOW (ref 3.87–5.11)
RDW: 20.4 % — AB (ref 11.5–15.5)
WBC: 12.8 10*3/uL — ABNORMAL HIGH (ref 4.0–10.5)

## 2014-02-18 LAB — COMPREHENSIVE METABOLIC PANEL
ALT: 23 U/L (ref 0–35)
AST: 72 U/L — AB (ref 0–37)
Albumin: 2.3 g/dL — ABNORMAL LOW (ref 3.5–5.2)
Alkaline Phosphatase: 134 U/L — ABNORMAL HIGH (ref 39–117)
BUN: 21 mg/dL (ref 6–23)
CALCIUM: 8.7 mg/dL (ref 8.4–10.5)
CO2: 13 mEq/L — ABNORMAL LOW (ref 19–32)
CREATININE: 1.36 mg/dL — AB (ref 0.50–1.10)
Chloride: 95 mEq/L — ABNORMAL LOW (ref 96–112)
GFR calc non Af Amer: 48 mL/min — ABNORMAL LOW (ref >90)
GFR, EST AFRICAN AMERICAN: 55 mL/min — AB (ref >90)
Glucose, Bld: 98 mg/dL (ref 70–99)
Potassium: 4.4 mEq/L (ref 3.7–5.3)
Sodium: 140 mEq/L (ref 137–147)
Total Bilirubin: 5.2 mg/dL — ABNORMAL HIGH (ref 0.3–1.2)
Total Protein: 7.3 g/dL (ref 6.0–8.3)

## 2014-02-18 LAB — LACTIC ACID, PLASMA: Lactic Acid, Venous: 16.9 mmol/L — ABNORMAL HIGH (ref 0.5–2.2)

## 2014-02-18 LAB — URINALYSIS, ROUTINE W REFLEX MICROSCOPIC
GLUCOSE, UA: NEGATIVE mg/dL
Ketones, ur: 15 mg/dL — AB
Nitrite: NEGATIVE
PROTEIN: 100 mg/dL — AB
SPECIFIC GRAVITY, URINE: 1.03 (ref 1.005–1.030)
Urobilinogen, UA: 2 mg/dL — ABNORMAL HIGH (ref 0.0–1.0)
pH: 5.5 (ref 5.0–8.0)

## 2014-02-18 LAB — PHOSPHORUS: PHOSPHORUS: 5.8 mg/dL — AB (ref 2.3–4.6)

## 2014-02-18 LAB — POCT I-STAT 3, ART BLOOD GAS (G3+)
Acid-base deficit: 1 mmol/L (ref 0.0–2.0)
Bicarbonate: 19.8 mEq/L — ABNORMAL LOW (ref 20.0–24.0)
O2 Saturation: 100 %
PCO2 ART: 22.2 mmHg — AB (ref 35.0–45.0)
PH ART: 7.559 — AB (ref 7.350–7.450)
PO2 ART: 560 mmHg — AB (ref 80.0–100.0)
Patient temperature: 98.1
TCO2: 21 mmol/L (ref 0–100)

## 2014-02-18 LAB — APTT: aPTT: 32 seconds (ref 24–37)

## 2014-02-18 LAB — CARBOXYHEMOGLOBIN
Carboxyhemoglobin: 1.3 % (ref 0.5–1.5)
Methemoglobin: 0.9 % (ref 0.0–1.5)
O2 Saturation: 62.6 %
TOTAL HEMOGLOBIN: 11.5 g/dL — AB (ref 12.0–16.0)

## 2014-02-18 LAB — MRSA PCR SCREENING: MRSA BY PCR: NEGATIVE

## 2014-02-18 LAB — URINE MICROSCOPIC-ADD ON

## 2014-02-18 LAB — TROPONIN I: Troponin I: <0.3 ng/mL (ref <0.30)

## 2014-02-18 LAB — GLUCOSE, CAPILLARY: Glucose-Capillary: 73 mg/dL (ref 70–99)

## 2014-02-18 LAB — MAGNESIUM: Magnesium: 1.9 mg/dL (ref 1.5–2.5)

## 2014-02-18 LAB — TSH: TSH: 0.008 u[IU]/mL — ABNORMAL LOW (ref 0.350–4.500)

## 2014-02-18 LAB — PROTIME-INR
INR: 2.32 — ABNORMAL HIGH (ref 0.00–1.49)
PROTHROMBIN TIME: 24.7 s — AB (ref 11.6–15.2)

## 2014-02-18 LAB — PRO B NATRIURETIC PEPTIDE: Pro B Natriuretic peptide (BNP): 20863 pg/mL — ABNORMAL HIGH (ref 0–125)

## 2014-02-18 MED ORDER — NOREPINEPHRINE BITARTRATE 1 MG/ML IV SOLN
2.0000 ug/min | INTRAVENOUS | Status: DC
  Administered 2014-02-18: 20 ug/min via INTRAVENOUS
  Filled 2014-02-18: qty 16

## 2014-02-18 MED ORDER — PANTOPRAZOLE SODIUM 40 MG IV SOLR
40.0000 mg | INTRAVENOUS | Status: DC
  Administered 2014-02-18 – 2014-02-20 (×3): 40 mg via INTRAVENOUS
  Filled 2014-02-18 (×6): qty 40

## 2014-02-18 MED ORDER — LEVOFLOXACIN IN D5W 750 MG/150ML IV SOLN
750.0000 mg | INTRAVENOUS | Status: DC
  Administered 2014-02-19: 750 mg via INTRAVENOUS

## 2014-02-18 MED ORDER — MIDAZOLAM HCL 2 MG/2ML IJ SOLN
2.0000 mg | Freq: Once | INTRAMUSCULAR | Status: AC
  Administered 2014-02-18: 2 mg via INTRAVENOUS

## 2014-02-18 MED ORDER — VANCOMYCIN HCL IN DEXTROSE 750-5 MG/150ML-% IV SOLN
750.0000 mg | Freq: Once | INTRAVENOUS | Status: AC
  Administered 2014-02-18: 750 mg via INTRAVENOUS
  Filled 2014-02-18: qty 150

## 2014-02-18 MED ORDER — CHLORHEXIDINE GLUCONATE 0.12 % MT SOLN
15.0000 mL | Freq: Two times a day (BID) | OROMUCOSAL | Status: DC
  Administered 2014-02-18 – 2014-02-22 (×8): 15 mL via OROMUCOSAL
  Filled 2014-02-18 (×8): qty 15

## 2014-02-18 MED ORDER — SODIUM BICARBONATE 8.4 % IV SOLN
INTRAVENOUS | Status: DC
  Administered 2014-02-18: 23:00:00 via INTRAVENOUS
  Filled 2014-02-18 (×2): qty 150

## 2014-02-18 MED ORDER — VANCOMYCIN HCL 500 MG IV SOLR
500.0000 mg | Freq: Two times a day (BID) | INTRAVENOUS | Status: DC
  Administered 2014-02-19 (×2): 500 mg via INTRAVENOUS
  Filled 2014-02-18 (×4): qty 500

## 2014-02-18 MED ORDER — FUROSEMIDE 10 MG/ML IJ SOLN
40.0000 mg | Freq: Four times a day (QID) | INTRAMUSCULAR | Status: DC
  Administered 2014-02-18 – 2014-02-19 (×3): 40 mg via INTRAVENOUS
  Filled 2014-02-18 (×7): qty 4

## 2014-02-18 MED ORDER — DAPSONE 100 MG PO TABS
100.0000 mg | ORAL_TABLET | Freq: Every day | ORAL | Status: DC
  Filled 2014-02-18: qty 1

## 2014-02-18 MED ORDER — ELVITEG-COBIC-EMTRICIT-TENOFDF 150-150-200-300 MG PO TABS: 1.0000 | ORAL_TABLET | Freq: Every day | ORAL | Status: DC

## 2014-02-18 MED ORDER — PIPERACILLIN-TAZOBACTAM 3.375 G IVPB
3.3750 g | Freq: Three times a day (TID) | INTRAVENOUS | Status: DC
  Administered 2014-02-19 – 2014-02-21 (×7): 3.375 g via INTRAVENOUS
  Filled 2014-02-18 (×10): qty 50

## 2014-02-18 MED ORDER — MIDAZOLAM HCL 2 MG/2ML IJ SOLN
2.0000 mg | INTRAMUSCULAR | Status: DC | PRN
  Filled 2014-02-18 (×2): qty 2

## 2014-02-18 MED ORDER — BIOTENE DRY MOUTH MT LIQD
1.0000 | Freq: Four times a day (QID) | OROMUCOSAL | Status: DC
Start: 2014-02-19 — End: 2014-02-18

## 2014-02-18 MED ORDER — PIPERACILLIN-TAZOBACTAM 3.375 G IVPB 30 MIN
3.3750 g | Freq: Once | INTRAVENOUS | Status: AC
  Administered 2014-02-18: 3.375 g via INTRAVENOUS
  Filled 2014-02-18: qty 50

## 2014-02-18 MED ORDER — CHLORHEXIDINE GLUCONATE 0.12 % MT SOLN: 15.0000 mL | Freq: Two times a day (BID) | OROMUCOSAL | Status: DC

## 2014-02-18 MED ORDER — HEPARIN SODIUM (PORCINE) 5000 UNIT/ML IJ SOLN
5000.0000 [IU] | Freq: Three times a day (TID) | INTRAMUSCULAR | Status: DC
  Administered 2014-02-18 – 2014-02-20 (×4): 5000 [IU] via SUBCUTANEOUS
  Filled 2014-02-18 (×8): qty 1

## 2014-02-18 MED ORDER — MIDAZOLAM HCL 5 MG/ML IJ SOLN
0.0000 mg/h | INTRAVENOUS | Status: DC
  Administered 2014-02-18: 2 mg/h via INTRAVENOUS
  Filled 2014-02-18: qty 10

## 2014-02-18 MED ORDER — BIOTENE DRY MOUTH MT LIQD
15.0000 mL | Freq: Four times a day (QID) | OROMUCOSAL | Status: DC
Start: 2014-02-19 — End: 2014-02-22
  Administered 2014-02-19 – 2014-02-22 (×14): 15 mL via OROMUCOSAL

## 2014-02-18 MED ORDER — LEVOFLOXACIN IN D5W 750 MG/150ML IV SOLN
750.0000 mg | INTRAVENOUS | Status: DC
  Filled 2014-02-18: qty 150

## 2014-02-18 MED ORDER — SODIUM CHLORIDE 0.9 % IV SOLN
25.0000 ug/h | INTRAVENOUS | Status: DC
  Administered 2014-02-18 – 2014-02-20 (×2): 50 ug/h via INTRAVENOUS
  Filled 2014-02-18 (×2): qty 50

## 2014-02-18 MED ORDER — NOREPINEPHRINE BITARTRATE 1 MG/ML IV SOLN
2.0000 ug/min | INTRAVENOUS | Status: DC
  Administered 2014-02-18: 20 ug/min via INTRAVENOUS
  Administered 2014-02-18: 10 ug/min via INTRAVENOUS
  Filled 2014-02-18: qty 4

## 2014-02-18 NOTE — H&P (Addendum)
PULMONARY / CRITICAL CARE MEDICINE  Name: Michelle Flores MRN: [id number] DOB: 03/14/1971    ADMISSION DATE:  02/28/2014 CONSULTATION DATE:  02/19/2014  REFERRING MD :  EDP PRIMARY SERVICE:  PCCM  CHIEF COMPLAINT:  Acute respiratory failure  BRIEF PATIENT DESCRIPTION: 43 yo HIV+ ( followed by Dr. Orvan Falconerampbell ) [facility] with nausea, vomiting and abdominal pain. Found to have elevated bilirubin. Today developed dyspnea, hypoxia and bilateral pulmonary infiltrates.  Noted to be severely acidotic.  Intubated.  Hypotensive post-intubation requiring vasopressors.  SIGNIFICANT EVENTS / STUDIES:  6/14  [facility] with bilateral pulmonary infiltrates, hypoxemic respiratory failure, hypotension 6/14  Bedside TTE >>> severe global myocardial hypokinesis   LINES / TUBES: OETT 6/14 >>> OGT 6/14 >>> Foley 6/14 >>> Left subclavian CVL 6/14 >>>  CULTURES: 6/14 Blood >>> 6/14 Urine >>> 6/14 Respiratory >>>  ANTIBIOTICS: Zosyn 6/14 >>> Vancomycin 6/14 >>> Levaquin 6/14 >>>  The patient is sedated and unable to provide history, which was obtained for available medical records and friends  HISTORY OF PRESENT ILLNESS:  43 yo HIV+ diagnosed in 2011. Unclear if on antiretroviral therapy. [facility] with nausea, vomiting and abdominal pain. Found to have elevated bilirubin. Today developed dyspnea, hypoxia, chest pain and bilateral pulmonary infiltrates.  Noted to be severely acidotic with a lactate of 16.  Intubated.  Hypotensive post-intubation requiring vasopressors. Of note, she has 2 daughters but she is not married and does not have any other family. Her pastors present in the Hospital are the closest persons to her. 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 As per the pastor. She has lost more than 30 pounds over the last two months. She has been complaining of severe back pain. Not eating well. Most recently she was  noticed to be SOB with minimal exertion. [facility] she is intubated, sedated, hypotensive and tachycardic. She has a right femoral CVC.  PAST MEDICAL HISTORY :  No past medical history on file. No past surgical history on file.  Prior to Admission medications   Medication Sig Start Date End Date Taking? Authorizing Provider  Bee Pollen 1000 MG CHEW Chew by mouth.    Historical Provider, MD  cholecalciferol (VITAMIN D) 1000 UNITS tablet Take 1,000 Units by mouth daily. Take 2 tablets by mouth daily.     Historical Provider, MD  cyclobenzaprine (FLEXERIL) 10 MG tablet Take 10 mg by mouth 3 (three) times daily as needed for muscle spasms.    Historical Provider, MD  dapsone 100 MG tablet Take 1 tablet (100 mg total) by mouth daily. 02/06/14   Cliffton AstersJohn Campbell, MD  dextromethorphan-guaiFENesin [facility](MUCINEX DM) 30-600 MG per 12 hr tablet Take 1 tablet by mouth every 12 (twelve) hours. 09/13/12   Cliffton AstersJohn Campbell, MD  elvitegravir-cobicistat-emtricitabine-tenofovir (STRIBILD) 150-150-200-300 MG TABS tablet Take 1 tablet by mouth daily with breakfast. 02/06/14   Cliffton AstersJohn Campbell, MD  loratadine (CLARITIN) 10 MG tablet Take 10 mg by mouth daily. 12/17/10   Cliffton AstersJohn Campbell, MD  Multiple Vitamin (MULTIVITAMIN) capsule Take 1 capsule by mouth daily.      Historical Provider, MD  naproxen (NAPROSYN) 250 MG tablet Take 250 mg by mouth 2 (two) times daily with a meal.    Historical Provider, MD  Noni, Morinda citrifolia, 400 MG CAPS Take 400 mg by mouth 2 (two) times daily. "Moringa"    Historical Provider, MD   Allergies  Allergen Reactions  . Diphenhydramine Hcl   .  Latex   . Sulfonamide Derivatives    FAMILY HISTORY:  No family history on file.  SOCIAL HISTORY:  reports that she has never smoked. She has never used smokeless tobacco. She reports that she does not drink alcohol or use illicit drugs.  REVIEW OF SYSTEMS:  Unable to provide.  INTERVAL HISTORY:  VITAL SIGNS: Temp:  [98.1 F (36.7 C)-98.9  F (37.2 C)] 98.9 F (37.2 C) (06/15 0008) Pulse Rate:  [115] 115 (06/14 2352) Resp:  [24] 24 (06/14 2352) BP: (106)/(67) 106/67 mmHg (06/14 2352) SpO2:  [100 %] 100 % (06/14 2352) FiO2 (%):  [60 %-100 %] 60 % (06/14 2352) Weight:  [112 lb 10.5 oz (51.1 kg)] 112 lb 10.5 oz (51.1 kg) (06/14 2000) HEMODYNAMICS: CVP:  [10 mmHg] 10 mmHg VENTILATOR SETTINGS: Vent Mode:  [-] PRVC FiO2 (%):  [60 %-100 %] 60 % Set Rate:  [24 bmp-30 bmp] 24 bmp Vt Set:  [500 mL] 500 mL PEEP:  [5 cmH20] 5 cmH20 Plateau Pressure:  [18 cmH20] 18 cmH20 INTAKE / OUTPUT: Intake/Output   None     PHYSICAL EXAMINATION: General:  Appears acutely ill, mechanically ventilated, synchronous Neuro:  Sedated, nonfocal HEENT:  PERRL, OETT / OGT. Mildly jaundiced Cardiovascular:  RRR, no m/r/g Lungs:  Bilateral basilar crackles. No wheezing. Good air movement.  Abdomen:  Soft, nontender, bowel sounds diminished Musculoskeletal:  Moves all extremities, 1 to 2 + edema Skin:  Intact  LABS: CBC  Recent Labs Lab Feb 25, 2014 1950  WBC 12.8*  HGB 11.3*  HCT 33.9*  PLT 121*   Coag's  Recent Labs Lab 02-25-14 1950  APTT 32  INR 2.32*   BMET  Recent Labs Lab 2014-02-25 1950  NA 140  K 4.4  CL 95*  CO2 13*  BUN 21  CREATININE 1.36*  GLUCOSE 98   Electrolytes  Recent Labs Lab Feb 25, 2014 1950  CALCIUM 8.7  MG 1.9  PHOS 5.8*   Sepsis Markers  Recent Labs Lab February 25, 2014 1943 02/19/14 0020  LATICACIDVEN 16.9* 9.7*   ABG  Recent Labs Lab Feb 25, 2014 2303  PHART 7.559*  PCO2ART 22.2*  PO2ART 560.0*   Liver Enzymes  Recent Labs Lab 2014/02/25 1950  AST 72*  ALT 23  ALKPHOS 134*  BILITOT 5.2*  ALBUMIN 2.3*   Cardiac Enzymes  Recent Labs Lab 02-25-14 1943 02-25-2014 1950 02/19/14 0008  TROPONINI <0.30  --  <0.30  PROBNP  --  20863.0*  --    Glucose  Recent Labs Lab 02-25-14 1951  GLUCAP 73   IMAGING:  Dg Chest Port 1 View  02-25-14   CLINICAL DATA:  Central line placement.   EXAM: PORTABLE CHEST - 1 VIEW  COMPARISON:  Several exams earlier today.  FINDINGS: Cardiomegaly remains present. Enteric tube and endotracheal tube unchanged. Interval placement of left subclavian central line with the tip in the mid SVC. No pneumothorax.  IMPRESSION: Uncomplicated left subclavian central line placement. Unchanged cardiomegaly and other support apparatus.   Electronically Signed   By: Andreas Newport M.D.   On: 02/25/14 22:44   Dg Chest Port 1 View  02/25/2014   CLINICAL DATA:  Endotracheal tube placement. Postprocedural radiograph.  EXAM: PORTABLE CHEST - 1 VIEW  COMPARISON:  2014/02/25 at 1805 hr.  FINDINGS: The endotracheal tube is unchanged. Interval introduction of an enteric tube with the tip not visualized. Redundant loop in the gastric fundus. Cardiomegaly remains present. Monitoring leads project over the chest. No airspace disease. No effusion.  IMPRESSION: Interval placement of enteric  tube. Endotracheal tube unchanged. Unchanged cardiomegaly.   Electronically Signed   By: Andreas Newport M.D.   On: 02/16/2014 20:21   ASSESSMENT / PLAN:  PULMONARY A:   Acute respiratory failure, unclear etiology, in the differential are pulmonary edema and pneumonia P:   Goal SpO2>92, pH>7.30 - Mechanical ventilation   - PRVC, Vt: 8cc/kg, PEEP: 5, RR: 16, FiO2: 100% and adjust to keep O2 sat > 94%   - VAP prevention order set   - Daily awakening and SBT Trend ABG / CXR  CARDIOVASCULAR A:  Acute systolic congestive heart failure. Bedside echo personally performed with LVEF of around 10% ( HIV-related? Hyperthyroidism?, Ischemia?) Shock ( Cardiogenic? Septic?) Severe lactic acidosis, unclear etiology, again heart failure, bowel ischemia given initial presentation with abdominal pain, N/V and recent significant weight loss.  P:  Goal MAP>60 Levophed gtt Trend troponin / lactate COOX Formal TTE in AM BB/ACEI contraindicated Consider Cardiology consult in am  RENAL A:    Severe metabolic acidosis (lactic), improving Acute renal failure P:   Trend BMP  GASTROINTESTINAL A:   Elevated bilirubin, N/V, broad differential including shock liver, thyroid storm, congestive hepatopaty GI Px Nutrition P:   NPO as intubated TF in am Protonix Trend LFT's STAT CT of abdomen and pelvis with contrast  HEMATOLOGIC A:   Coagulopathy (elevated INR) unclear if on coumadin. No list of medications available. May be related to liver dysfunction. Mild anemia Mild thrombocytopenia VTE P:  Trend CBC Heparin APTT / INR DIC panel  INFECTIOUS A:   HIV+ Possible pneumonia, or other source of infection.  Last CD4 count from April was 100 with viral load of 191968 P:   Cultures and antibiotics as above PCT CD4 count Hold Dapsone, Stribild Consult ID in AM  ENDOCRINE  A:   Unknown adrenal / thyroid function TSH 0.008. T3, T4 pending. Meets criteria for thyroid storm given nausea, vomiting, tachycardia, unexplained jaundice, cardiac dysfunction. P:   Cortisol level Awaitng free T4, total T3 Will start PTU 200 mg q4hrs Lugol 10 drops SL every 8 hrs and will start 1 hr after 1st dose of PTU Hydrocortisone 100 mg IV q8 hrs We will hold beta blockers given hypotension and pressor requirements  NEUROLOGIC A:   Acute encephalopathy P:   Fentanyl gtt Versed PRN Goal RASS  -1 Daily WUA  I have personally obtained history, examined patient, evaluated and interpreted laboratory and imaging results, reviewed medical records, formulated assessment / plan and placed orders.  CRITICAL CARE:  The patient is critically ill with multiple organ systems failure and requires high complexity decision making for assessment and support, frequent evaluation and titration of therapies, application of advanced monitoring technologies and extensive interpretation of multiple databases. Critical Care Time devoted to patient care services described in this note is 90 minutes.    [name], MD Pulmonary and Critical Care Medicine [facility] Pager: [phone]  02/19/2014, 1:36 AM

## 2014-02-18 NOTE — Progress Notes (Addendum)
ANTIBIOTIC CONSULT NOTE - INITIAL  Pharmacy Consult for vancomycin, Zosyn, and levofloxacin Indication: pneumonia  Allergies  Allergen Reactions  . Diphenhydramine Hcl   . Latex   . Sulfonamide Derivatives     Patient Measurements: Weight 50.8 kg on 02/06/14 Height 168 cm   Vital Signs:    Labs: No results found for this basename: WBC, HGB, PLT, LABCREA, CREATININE,  in the last 72 hours The CrCl is unknown because both a height and weight (above a minimum accepted value) are required for this calculation.  Microbiology: No results found for this or any previous visit (from the past 720 hour(s)).  Medical History: No past medical history on file.  Assessment: 10 YOF with HIV (last CD4 T Cell abs 100, HIV RNA Quant 191K on 01/02/14) admitted from [facility] for pneumonia to start vancomycin, zosyn, and levaquin. Patient also has a dirty UA.  SCr today is 0.87/ estimated CrCl~68 (lab from [place]) . Patient also with jaundice, elevated tbili (6.1) & INR 2.32, and abdominal pain. Abd CT shows gallbladder thickening. Patient is afebrile and wbc 12.8.   Goal of Therapy:  Vancomycin trough level 15-20 mcg/ml  Plan:  1. Vancomycin 750mg  IV x1, then 500mg  IV q12h.  2. Zosyn 3.375g IV once over , then 3.375g IV q8h- 4 hr infusion.  3. Levaquin 750mg  IV q24h.   Link Snuffer, PharmD, BCPS Clinical Pharmacist [phone] 02/10/2014,7:50 PM   Addendum: SCr up 1.3/estCrCl~80mL/min.  Plan: Adjust Levaquin to 750mg  IV q48h.  9:59 PM, 02/28/2014

## 2014-02-19 ENCOUNTER — Inpatient Hospital Stay (HOSPITAL_COMMUNITY): Payer: Medicaid Other

## 2014-02-19 ENCOUNTER — Other Ambulatory Visit: Payer: Self-pay

## 2014-02-19 DIAGNOSIS — I059 Rheumatic mitral valve disease, unspecified: Secondary | ICD-10-CM

## 2014-02-19 DIAGNOSIS — I469 Cardiac arrest, cause unspecified: Secondary | ICD-10-CM

## 2014-02-19 DIAGNOSIS — R579 Shock, unspecified: Secondary | ICD-10-CM

## 2014-02-19 DIAGNOSIS — J9600 Acute respiratory failure, unspecified whether with hypoxia or hypercapnia: Secondary | ICD-10-CM

## 2014-02-19 DIAGNOSIS — E872 Acidosis, unspecified: Secondary | ICD-10-CM

## 2014-02-19 DIAGNOSIS — E43 Unspecified severe protein-calorie malnutrition: Secondary | ICD-10-CM | POA: Insufficient documentation

## 2014-02-19 DIAGNOSIS — R570 Cardiogenic shock: Secondary | ICD-10-CM

## 2014-02-19 DIAGNOSIS — J9601 Acute respiratory failure with hypoxia: Secondary | ICD-10-CM

## 2014-02-19 DIAGNOSIS — K828 Other specified diseases of gallbladder: Secondary | ICD-10-CM

## 2014-02-19 DIAGNOSIS — I5021 Acute systolic (congestive) heart failure: Principal | ICD-10-CM

## 2014-02-19 DIAGNOSIS — R112 Nausea with vomiting, unspecified: Secondary | ICD-10-CM

## 2014-02-19 LAB — BLOOD GAS, ARTERIAL
Acid-Base Excess: 5.3 mmol/L — ABNORMAL HIGH (ref 0.0–2.0)
BICARBONATE: 26.3 meq/L — AB (ref 20.0–24.0)
FIO2: 0.6 %
MECHVT: 500 mL
O2 Saturation: 100 %
PEEP: 5 cmH2O
PH ART: 7.715 — AB (ref 7.350–7.450)
PO2 ART: 332 mmHg — AB (ref 80.0–100.0)
Patient temperature: 98.3
RATE: 24 resp/min
TCO2: 26.9 mmol/L (ref 0–100)
pCO2 arterial: 19.9 mmHg — CL (ref 35.0–45.0)

## 2014-02-19 LAB — GLUCOSE, CAPILLARY
Glucose-Capillary: 130 mg/dL — ABNORMAL HIGH (ref 70–99)
Glucose-Capillary: 167 mg/dL — ABNORMAL HIGH (ref 70–99)

## 2014-02-19 LAB — POCT I-STAT 3, ART BLOOD GAS (G3+)
Acid-Base Excess: 9 mmol/L — ABNORMAL HIGH (ref 0.0–2.0)
Acid-base deficit: 1 mmol/L (ref 0.0–2.0)
Bicarbonate: 34.5 mEq/L — ABNORMAL HIGH (ref 20.0–24.0)
Bicarbonate: 24.5 mEq/L — ABNORMAL HIGH (ref 20.0–24.0)
O2 SAT: 100 %
O2 Saturation: 100 %
PH ART: 7.346 — AB (ref 7.350–7.450)
PO2 ART: 229 mmHg — AB (ref 80.0–100.0)
TCO2: 36 mmol/L (ref 0–100)
TCO2: 26 mmol/L (ref 0–100)
pCO2 arterial: 49.4 mmHg — ABNORMAL HIGH (ref 35.0–45.0)
pCO2 arterial: 44.9 mmHg (ref 35.0–45.0)
pH, Arterial: 7.452 — ABNORMAL HIGH (ref 7.350–7.450)
pO2, Arterial: 194 mmHg — ABNORMAL HIGH (ref 80.0–100.0)

## 2014-02-19 LAB — BASIC METABOLIC PANEL
BUN: 21 mg/dL (ref 6–23)
BUN: 22 mg/dL (ref 6–23)
BUN: 22 mg/dL (ref 6–23)
CALCIUM: 9.4 mg/dL (ref 8.4–10.5)
CHLORIDE: 93 meq/L — AB (ref 96–112)
CHLORIDE: 95 meq/L — AB (ref 96–112)
CO2: 24 meq/L (ref 19–32)
CO2: 30 mEq/L (ref 19–32)
CO2: 13 mEq/L — ABNORMAL LOW (ref 19–32)
Calcium: 8.2 mg/dL — ABNORMAL LOW (ref 8.4–10.5)
Calcium: 8 mg/dL — ABNORMAL LOW (ref 8.4–10.5)
Chloride: 101 mEq/L (ref 96–112)
Creatinine, Ser: 1.04 mg/dL (ref 0.50–1.10)
Creatinine, Ser: 1.03 mg/dL (ref 0.50–1.10)
Creatinine, Ser: 1.31 mg/dL — ABNORMAL HIGH (ref 0.50–1.10)
GFR calc Af Amer: 76 mL/min — ABNORMAL LOW (ref >90)
GFR calc Af Amer: 58 mL/min — ABNORMAL LOW (ref >90)
GFR calc non Af Amer: 66 mL/min — ABNORMAL LOW (ref >90)
GFR, EST AFRICAN AMERICAN: 77 mL/min — AB (ref >90)
GFR, EST NON AFRICAN AMERICAN: 67 mL/min — AB (ref >90)
GFR, EST NON AFRICAN AMERICAN: 50 mL/min — AB (ref >90)
GLUCOSE: 146 mg/dL — AB (ref 70–99)
Glucose, Bld: 133 mg/dL — ABNORMAL HIGH (ref 70–99)
Glucose, Bld: 189 mg/dL — ABNORMAL HIGH (ref 70–99)
POTASSIUM: 3.1 meq/L — AB (ref 3.7–5.3)
POTASSIUM: 3.5 meq/L — AB (ref 3.7–5.3)
Potassium: 4.4 mEq/L (ref 3.7–5.3)
SODIUM: 136 meq/L — AB (ref 137–147)
SODIUM: 139 meq/L (ref 137–147)
SODIUM: 145 meq/L (ref 137–147)

## 2014-02-19 LAB — CARBOXYHEMOGLOBIN
CARBOXYHEMOGLOBIN: 1.5 % (ref 0.5–1.5)
Carboxyhemoglobin: 1.7 % — ABNORMAL HIGH (ref 0.5–1.5)
Methemoglobin: 0.8 % (ref 0.0–1.5)
Methemoglobin: 0.9 % (ref 0.0–1.5)
O2 SAT: 58.6 %
O2 Saturation: 80.8 %
Total hemoglobin: 10.3 g/dL — ABNORMAL LOW (ref 12.0–16.0)
Total hemoglobin: 11.7 g/dL — ABNORMAL LOW (ref 12.0–16.0)

## 2014-02-19 LAB — MAGNESIUM
MAGNESIUM: 2.2 mg/dL (ref 1.5–2.5)
Magnesium: 1.5 mg/dL (ref 1.5–2.5)

## 2014-02-19 LAB — CBC
HCT: 30.3 % — ABNORMAL LOW (ref 36.0–46.0)
HEMATOCRIT: 36.1 % (ref 36.0–46.0)
Hemoglobin: 12.4 g/dL (ref 12.0–15.0)
Hemoglobin: 10.1 g/dL — ABNORMAL LOW (ref 12.0–15.0)
MCH: 28.8 pg (ref 26.0–34.0)
MCH: 29.5 pg (ref 26.0–34.0)
MCHC: 34.3 g/dL (ref 30.0–36.0)
MCHC: 33.3 g/dL (ref 30.0–36.0)
MCV: 83.8 fL (ref 78.0–100.0)
MCV: 88.6 fL (ref 78.0–100.0)
PLATELETS: 79 10*3/uL — AB (ref 150–400)
Platelets: DECREASED 10*3/uL (ref 150–400)
RBC: 4.31 MIL/uL (ref 3.87–5.11)
RBC: 3.42 MIL/uL — ABNORMAL LOW (ref 3.87–5.11)
RDW: 19.9 % — ABNORMAL HIGH (ref 11.5–15.5)
RDW: 20.5 % — ABNORMAL HIGH (ref 11.5–15.5)
WBC: 8.6 10*3/uL (ref 4.0–10.5)
WBC: 17.1 10*3/uL — AB (ref 4.0–10.5)

## 2014-02-19 LAB — TROPONIN I

## 2014-02-19 LAB — T3: T3 TOTAL: 91 ng/dL (ref 80.0–204.0)

## 2014-02-19 LAB — T-HELPER CELLS (CD4) COUNT (NOT AT ARMC)
CD4 T CELL HELPER: 28 % — AB (ref 33–55)
CD4 T Cell Abs: 240 /uL — ABNORMAL LOW (ref 400–2700)

## 2014-02-19 LAB — LACTIC ACID, PLASMA
LACTIC ACID, VENOUS: 1.8 mmol/L (ref 0.5–2.2)
Lactic Acid, Venous: 17.5 mmol/L — ABNORMAL HIGH (ref 0.5–2.2)
Lactic Acid, Venous: 2 mmol/L (ref 0.5–2.2)
Lactic Acid, Venous: 9.7 mmol/L — ABNORMAL HIGH (ref 0.5–2.2)

## 2014-02-19 LAB — PHOSPHORUS: PHOSPHORUS: 3.8 mg/dL (ref 2.3–4.6)

## 2014-02-19 LAB — T4, FREE: FREE T4: 1.82 ng/dL — AB (ref 0.80–1.80)

## 2014-02-19 LAB — CORTISOL: CORTISOL PLASMA: 39.5 ug/dL

## 2014-02-19 MED ORDER — PHENYLEPHRINE HCL 10 MG/ML IJ SOLN
30.0000 ug/min | INTRAVENOUS | Status: DC
  Filled 2014-02-19 (×2): qty 4

## 2014-02-19 MED ORDER — HYDROCORTISONE NA SUCCINATE PF 100 MG IJ SOLR
100.0000 mg | Freq: Three times a day (TID) | INTRAMUSCULAR | Status: DC
  Administered 2014-02-19 – 2014-02-20 (×5): 100 mg via INTRAVENOUS
  Filled 2014-02-19 (×8): qty 2

## 2014-02-19 MED ORDER — AMIODARONE HCL IN DEXTROSE 360-4.14 MG/200ML-% IV SOLN
30.0000 mg/h | INTRAVENOUS | Status: DC
  Administered 2014-02-19 – 2014-02-22 (×7): 30 mg/h via INTRAVENOUS
  Filled 2014-02-19 (×13): qty 200

## 2014-02-19 MED ORDER — VASOPRESSIN 20 UNIT/ML IJ SOLN
0.0300 [IU]/min | INTRAVENOUS | Status: DC
  Filled 2014-02-19 (×2): qty 2.5

## 2014-02-19 MED ORDER — AMIODARONE HCL IN DEXTROSE 360-4.14 MG/200ML-% IV SOLN
60.0000 mg/h | INTRAVENOUS | Status: AC
  Administered 2014-02-19: 60 mg/h via INTRAVENOUS

## 2014-02-19 MED ORDER — MAGNESIUM SULFATE 40 MG/ML IJ SOLN
INTRAMUSCULAR | Status: AC
  Filled 2014-02-19: qty 50

## 2014-02-19 MED ORDER — VITAL AF 1.2 CAL PO LIQD
1000.0000 mL | ORAL | Status: DC
  Filled 2014-02-19 (×3): qty 1000

## 2014-02-19 MED ORDER — EPINEPHRINE HCL 0.1 MG/ML IJ SOSY: 1.0000 mg | PREFILLED_SYRINGE | Freq: Once | INTRAMUSCULAR | Status: DC

## 2014-02-19 MED ORDER — SODIUM CHLORIDE 0.9 % IV BOLUS (SEPSIS)
2000.0000 mL | Freq: Once | INTRAVENOUS | Status: AC
  Administered 2014-02-19: 2000 mL via INTRAVENOUS

## 2014-02-19 MED ORDER — IODINE STRONG (LUGOLS) 5 % PO SOLN
0.2000 mL | Freq: Three times a day (TID) | ORAL | Status: DC
  Administered 2014-02-19 (×4): 0.2 mL via ORAL
  Filled 2014-02-19 (×7): qty 0.2

## 2014-02-19 MED ORDER — SODIUM CHLORIDE 0.9 % IV BOLUS (SEPSIS): 1000.0000 mL | Freq: Once | INTRAVENOUS | Status: DC

## 2014-02-19 MED ORDER — DEXTROSE 5 % IV SOLN
500.0000 mg | INTRAVENOUS | Status: DC
  Administered 2014-02-19: 500 mg via INTRAVENOUS
  Filled 2014-02-19 (×2): qty 500

## 2014-02-19 MED ORDER — IOHEXOL 350 MG/ML SOLN
100.0000 mL | Freq: Once | INTRAVENOUS | Status: AC | PRN
  Administered 2014-02-19: 100 mL via INTRAVENOUS

## 2014-02-19 MED ORDER — POTASSIUM CHLORIDE 20 MEQ/15ML (10%) PO LIQD
40.0000 meq | Freq: Once | ORAL | Status: AC
  Administered 2014-02-19: 40 meq
  Filled 2014-02-19: qty 30

## 2014-02-19 MED ORDER — SODIUM BICARBONATE 8.4 % IV SOLN: 50.0000 meq | Freq: Once | INTRAVENOUS | Status: DC

## 2014-02-19 MED ORDER — PROPYLTHIOURACIL 50 MG PO TABS
200.0000 mg | ORAL_TABLET | ORAL | Status: DC
  Administered 2014-02-19 – 2014-02-20 (×10): 200 mg via NASOGASTRIC
  Filled 2014-02-19 (×15): qty 4

## 2014-02-19 MED ORDER — SODIUM CHLORIDE 0.9 % IV SOLN: INTRAVENOUS | Status: DC

## 2014-02-19 MED ORDER — VASOPRESSIN 20 UNIT/ML IJ SOLN
0.0300 [IU]/min | INTRAVENOUS | Status: DC
  Administered 2014-02-19: 0.03 [IU]/min via INTRAVENOUS

## 2014-02-19 MED ORDER — CALCIUM CHLORIDE 10 % IV SOLN
1.0000 g | Freq: Once | INTRAVENOUS | Status: AC
  Administered 2014-02-19: 1 g via INTRAVENOUS

## 2014-02-19 MED ORDER — METHIMAZOLE 10 MG PO TABS: 20.0000 mg | ORAL_TABLET | Freq: Four times a day (QID) | ORAL | Status: DC

## 2014-02-19 MED ORDER — METRONIDAZOLE 50 MG/ML ORAL SUSPENSION
250.0000 mg | Freq: Three times a day (TID) | ORAL | Status: DC
  Administered 2014-02-19: 250 mg via ORAL
  Filled 2014-02-19 (×6): qty 5

## 2014-02-19 MED ORDER — METRONIDAZOLE 50 MG/ML ORAL SUSPENSION
500.0000 mg | Freq: Three times a day (TID) | ORAL | Status: DC
  Filled 2014-02-19 (×3): qty 10

## 2014-02-19 MED ORDER — EPINEPHRINE HCL 0.1 MG/ML IJ SOSY
1.0000 mg | PREFILLED_SYRINGE | Freq: Once | INTRAMUSCULAR | Status: AC
  Administered 2014-02-19: 1 mg via INTRAVENOUS

## 2014-02-19 MED ORDER — AMIODARONE IV BOLUS ONLY 150 MG/100ML
150.0000 mg | Freq: Once | INTRAVENOUS | Status: AC
  Administered 2014-02-19: 150 mg via INTRAVENOUS

## 2014-02-19 MED ORDER — AMIODARONE HCL IN DEXTROSE 360-4.14 MG/200ML-% IV SOLN
INTRAVENOUS | Status: AC
  Administered 2014-02-19: 60 mg/h
  Filled 2014-02-19: qty 200

## 2014-02-19 MED ORDER — MAGNESIUM SULFATE 40 MG/ML IJ SOLN
2.0000 g | Freq: Once | INTRAMUSCULAR | Status: AC
  Administered 2014-02-19: 2 g via INTRAVENOUS

## 2014-02-19 MED ORDER — DEXTROSE 5 % IV SOLN
2.0000 ug/min | INTRAVENOUS | Status: DC
  Administered 2014-02-19 (×2): 50 ug/min via INTRAVENOUS
  Administered 2014-02-20: 35 ug/min via INTRAVENOUS
  Administered 2014-02-20: 42 ug/min via INTRAVENOUS
  Administered 2014-02-21: 35 ug/min via INTRAVENOUS
  Administered 2014-02-21 – 2014-02-22 (×3): 40 ug/min via INTRAVENOUS
  Administered 2014-02-22: 38 ug/min via INTRAVENOUS
  Filled 2014-02-19 (×10): qty 16

## 2014-02-19 MED ORDER — MILRINONE IN DEXTROSE 20 MG/100ML IV SOLN
0.3750 ug/kg/min | INTRAVENOUS | Status: DC
  Administered 2014-02-19 – 2014-02-22 (×5): 0.375 ug/kg/min via INTRAVENOUS
  Filled 2014-02-19 (×5): qty 100

## 2014-02-19 NOTE — Progress Notes (Signed)
Pt transported to and from CT w/o event 

## 2014-02-19 NOTE — Significant Event (Signed)
Called emergently to [facility] [employer] At time of my arrival, pt had pulse restored by Code Team Bedside personnel describe sudden PEA arrest without change in rhythm Subsequently developed VF per Dr Bosie Clos Received epi X 4, Ca2+ and HCO3 Report of VT last night noted Echo revealing LVEF of 15% and no pericardial effusion noted CVP 7 mmHg Bilateral BS noted Lab data reviewed CXR this AM revealed CM with no edema Presently on NE @ 50 mcg/min and vasopressin with HR 140-150 (appears to be sinus)  INTERVENTIONS: DC furosemide NS 2 liters ordered Amiodarone bolus and infusion ordered Add phenylephrine - MAP goal 65 mmHg STAT CXR and EKG ordered STAT BMET, CBC   30 mins CCM time  Billy Fischer, MD ; [facility] service Mobile [phone].  After 5:30 PM or weekends, call [phone]

## 2014-02-19 NOTE — Progress Notes (Signed)
eLink Physician-Brief Progress Note Patient Name: Michelle Flores DOB: 15-Jan-1971 MRN: [id number]  Date of Service  02/19/2014   HPI/Events of Note   Per RN, ~30 beat run Vtach with hypotension CVL OK on CXR  eICU Interventions  Check BMET, Mg now    Intervention Category Intermediate Interventions: Arrhythmia - evaluation and management  Panayiotis Rain[place] 02/19/2014, 4:08 AM

## 2014-02-19 NOTE — Code Documentation (Signed)
CODE BLUE NOTE  Patient Name: Michelle Flores   MRN: [id number]   Date of Birth/ Sex: [birth year] , female      Admission Date: 02/28/2014  Attending Provider: Lonia Farber*  Primary Diagnosis: <principal problem not specified>    Indication: Michelle Flores is a 43 yo HIV+ who presented to [facility] with nausea, vomiting and abdominal pain. Found to have elevated bilirubin. Today developed dyspnea, hypoxia and bilateral pulmonary infiltrates. Noted to be severely acidotic. Intubated. Hypotensive post-intubation requiring vasopressors. She was noted to be unresponsive and pulseless, in a rhythm of PEA. Code blue was subsequently called. At the time of arrival on scene, ACLS protocol was underway.   Technical Description:  - CPR performance duration:  18 minutes (prior to transition to PCCM management of Code Blue)  - Was defibrillation or cardioversion used? No   - Was external pacer placed? No  - Was patient intubated pre/post CPR? Yes - pre    Medications Administered: Y = Yes; Blank = No Amiodarone    Atropine    Calcium  Y x 1  Epinephrine  Y x 4  Lidocaine    Magnesium    Norepinephrine    Phenylephrine    Sodium bicarbonate  Y x 1  Vasopressin      Post CPR evaluation:  - Final Status - Was patient successfully resuscitated ? Yes - regained pulse - What is current rhythm? Sinus rhythm - What is current hemodynamic status? Tachycardic, pressor support to obtain goal MAP   Miscellaneous Information:  - Labs sent, including: BMET, CBC, CXR, EKG  - Primary team notified?  Yes  - Family Notified? Yes  - Additional notes/ transfer status: To remain in ICU - PCCM primary management       In attendance at Code Blue:  Kristie Cowman, MD - Internal Medicine Resident, PGY-3 Saralyn Pilar, DO - Family Medicine Resident, PGY-1 02/19/2014, 2:46 PM

## 2014-02-19 NOTE — H&P (Addendum)
Central Venous Catheter Insertion Procedure Note Michelle Flores [id number] Oct 14, 1970  Procedure: Insertion of Central Venous Catheter Indications: Assessment of intravascular volume, Drug and/or fluid administration and Frequent blood sampling  Procedure Details Consent: Unable to obtain consent because of altered level of consciousness. Time Out: Verified patient identification, verified procedure, site/side was marked, verified correct patient position, special equipment/implants available, medications/allergies/relevent history reviewed, required imaging and test results available.  Performed  Maximum sterile technique was used including antiseptics, cap, gloves, gown, hand hygiene, mask and sheet. Skin prep: Chlorhexidine; local anesthetic administered A antimicrobial bonded/coated triple lumen catheter was placed in the left subclavian vein using the Seldinger technique. Procedure done under direct visualization with ultrasound.   Evaluation Blood flow good Complications: No apparent complications Patient did tolerate procedure well. Chest X-ray ordered to verify placement.  CXR: normal.  Michelle Flores 02/19/2014, 1:52 AM

## 2014-02-19 NOTE — Consult Note (Signed)
Advanced Heart Failure Team Consult Note  Referring Physician: Dr Marin Shutter ID: Dr Orvan Falconer.   Reason for Consultation: Code Blue and Cardiogenic shock  HPI:    The Heart Failure Team was asked to consult for cardiogenic shock.   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 She had elevated bilirubin and was severely acidotic (lactic acid 16.9) requiring intubation and transfer to [facility]. After intubation she was hypotension and required vasopressor.  Bedside TEE performed EF 10%  severe global hypokinesis. Treatment initiated for hyperthyroidism with PTU, steroids and iodine.   Over night she had frequent V tach.  She has been placed on Levo and Vasopressin. This afternoon she had PEA arrest and followed by VF x 3. She received Epi x 4 as well as calcium and bicarb.    SH: Single Has 2 daughters 57 and 72 years old.  Does not smoke or drink alcohol.  FH: Currently no family present and she is intubated.   Review of Systems: [y] = yes, [ ]  = no  Intubated -> unavailable  Home Medications Prior to Admission medications   Medication Sig Start Date End Date Taking? Authorizing Provider  elvitegravir-cobicistat-emtricitabine-tenofovir (STRIBILD) 150-150-200-300 MG TABS tablet Take 1 tablet by mouth daily with breakfast. 02/06/14  Yes Cliffton Asters, MD  Multiple Vitamin (MULTIVITAMIN WITH MINERALS) TABS tablet Take 1 tablet by mouth daily.   Yes Historical Provider, MD  Bee Pollen 1000 MG CHEW Chew by mouth.    Historical Provider, MD  cholecalciferol (VITAMIN D) 1000 UNITS tablet Take 1,000 Units by mouth daily. Take 2 tablets by mouth daily.     Historical Provider, MD  Noni, Morinda citrifolia, 400 MG CAPS Take 400 mg by mouth 2 (two) times daily. "Moringa"    Historical Provider, MD    Past Medical History: No past medical history on file.  Past Surgical History: No past surgical  history on file.  Family History: No family history on file.  Social History: History   Social History  . Marital Status: Single    Spouse Name: N/A    Number of Children: N/A  . Years of Education: N/A   Social History Main Topics  . Smoking status: Never Smoker   . Smokeless tobacco: Never Used  . Alcohol Use: No  . Drug Use: No  . Sexual Activity: No     Comment: accepting condoms   Other Topics Concern  . Not on file   Social History Narrative  . No narrative on file    Allergies:  Allergies  Allergen Reactions  . Diphenhydramine Hcl   . Latex   . Sulfonamide Derivatives     Objective:    Vital Signs:   Temp:  [97.6 F (36.4 C)-98.9 F (37.2 C)] 97.6 F (36.4 C) (06/15 1220) Pulse Rate:  [66-123] 106 (06/15 1200) Resp:  [12-30] 12 (06/15 1200) BP: (38-122)/(15-99) 101/66 mmHg (06/15 1200) SpO2:  [100 %] 100 % (06/15 1200) Arterial Line BP: (88-133)/(44-83) 105/62 mmHg (06/15 1200) FiO2 (%):  [30 %-100 %] 100 % (06/15 1430) Weight:  [111 lb 5.3 oz (50.5 kg)-112 lb 10.5 oz (51.1 kg)] 111 lb 5.3 oz (50.5 kg) (06/15 0400) Last BM Date:  (PTA)  Weight change: Filed Weights   02/10/2014 2000 02/19/14 0400  Weight: 112 lb 10.5 oz (51.1 kg) 111 lb 5.3 oz (50.5 kg)    Intake/Output:   Intake/Output  Summary (Last 24 hours) at 02/19/14 1530 Last data filed at 02/19/14 1300  Gross per 24 hour  Intake 2337.12 ml  Output   3300 ml  Net -962.88 ml     Physical Exam: General:  Intubated. No resp difficulty HEENT: normal Neck: supple. JVP ~10. Carotids 2+ bilat; no bruits. No lymphadenopathy or thryomegaly appreciated. Cor: PMI laterally displaced. Tachy regular. sumamtion gallop Lungs: clear Abdomen: soft, nontender, nondistended. No hepatosplenomegaly. No bruits or masses. Good bowel sounds. Extremities: Extremities cool . no cyanosis, clubbing, rash, edema Neuro: alert & orientedx3, cranial nerves grossly intact. moves all 4 extremities w/o difficulty.  Affect pleasant  Telemetry:  Sinus tach  Labs: Basic Metabolic Panel:  Recent Labs Lab 08-Mar-2014 1950 02/19/14 0420 02/19/14 1000 02/19/14 1113  NA 140 136* 139  --   K 4.4 3.1* 3.5*  --   CL 95* 93* 95*  --   CO2 13* 24 30  --   GLUCOSE 98 146* 133*  --   BUN 21 21 22   --   CREATININE 1.36* 1.04 1.03  --   CALCIUM 8.7 8.2* 8.0*  --   MG 1.9 1.5  --  2.2  PHOS 5.8*  --   --  3.8    Liver Function Tests:  Recent Labs Lab 03/08/2014 1950  AST 72*  ALT 23  ALKPHOS 134*  BILITOT 5.2*  PROT 7.3  ALBUMIN 2.3*   No results found for this basename: LIPASE, AMYLASE,  in the last 168 hours No results found for this basename: AMMONIA,  in the last 168 hours  CBC:  Recent Labs Lab 2014-03-08 1950 02/19/14 0420  WBC 12.8* 8.6  NEUTROABS 11.0*  --   HGB 11.3* 12.4  HCT 33.9* 36.1  MCV 87.8 83.8  PLT 121* PLATELET CLUMPS NOTED ON SMEAR, COUNT APPEARS DECREASED    Cardiac Enzymes:  Recent Labs Lab 03-08-2014 1943 02/19/14 0008 02/19/14 0743  TROPONINI <0.30 <0.30 <0.30    BNP: BNP (last 3 results)  Recent Labs  Mar 08, 2014 1950  PROBNP 91478.2*    CBG:  Recent Labs Lab March 08, 2014 1951 02/19/14 1141  GLUCAP 73 130*    Coagulation Studies:  Recent Labs  2014/03/08 1950  LABPROT 24.7*  INR 2.32*    Other results: EKG:ST 150 bpm biatrial enlargement anterolateral ST-T abnormalities   Imaging: Dg Chest Port 1 View  02/19/2014   CLINICAL DATA:  Hypoxia  EXAM: PORTABLE CHEST - 1 VIEW  COMPARISON:  March 08, 2014  FINDINGS: The endotracheal tube tip is 3.5 cm above the carina. Central catheter tip is in the superior vena cava. Nasogastric tube tip and side port are in the stomach. No pneumothorax.  There is no edema or consolidation. Heart is upper normal in size with normal pulmonary vascularity. No adenopathy. No bone lesions.  IMPRESSION: Tube and catheter positions as described without pneumothorax. No edema or consolidation.   Electronically Signed   By:  Bretta Bang M.D.   On: 02/19/2014 12:35   Dg Chest Port 1 View  03/08/2014   CLINICAL DATA:  Central line placement.  EXAM: PORTABLE CHEST - 1 VIEW  COMPARISON:  Several exams earlier today.  FINDINGS: Cardiomegaly remains present. Enteric tube and endotracheal tube unchanged. Interval placement of left subclavian central line with the tip in the mid SVC. No pneumothorax.  IMPRESSION: Uncomplicated left subclavian central line placement. Unchanged cardiomegaly and other support apparatus.   Electronically Signed   By: Andreas Newport M.D.   On: 03/08/14  22:44   Dg Chest Port 1 View  18-Jul-2014   CLINICAL DATA:  Endotracheal tube placement. Postprocedural radiograph.  EXAM: PORTABLE CHEST - 1 VIEW  COMPARISON:  011-Nov-2015 at 1805 hr.  FINDINGS: The endotracheal tube is unchanged. Interval introduction of an enteric tube with the tip not visualized. Redundant loop in the gastric fundus. Cardiomegaly remains present. Monitoring leads project over the chest. No airspace disease. No effusion.  IMPRESSION: Interval placement of enteric tube. Endotracheal tube unchanged. Unchanged cardiomegaly.   Electronically Signed   By: Andreas NewportGeoffrey  Lamke M.D.   On: 011-Nov-2015 20:21   Ct Angio Abd/pel W/ And/or W/o  02/19/2014   CLINICAL DATA:  Elevated lactic acid. Shock. Assess for bowel ischemia.  EXAM: CTA ABDOMEN AND PELVIS WITHOUT AND WITH CONTRAST  TECHNIQUE: Multidetector CT imaging of the abdomen and pelvis was performed using the standard protocol during bolus administration of intravenous contrast. Multiplanar reconstructed images and MIPs were obtained and reviewed to evaluate the vascular anatomy.  CONTRAST:  100mL OMNIPAQUE IOHEXOL 350 MG/ML SOLN  COMPARISON:  CT of the abdomen and pelvis performed 02/17/2014, and abdominal ultrasound performed 011-Nov-2015  FINDINGS: No calcific atherosclerotic disease is seen. There is no evidence of luminal narrowing. The abdominal aorta is unremarkable in appearance. The  celiac trunk, superior mesenteric artery, bilateral renal arteries and inferior mesenteric artery are unremarkable in appearance. There is no evidence of occlusion. Visualized mesenteric vessels remain fully patent.  Minimal bibasilar opacities likely reflect atelectasis.  The liver and spleen are unremarkable in appearance. There is persistent diffuse gallbladder wall edema, with vicarious contrast excretion noted in the gallbladder. The pancreas and adrenal glands are unremarkable.  Scattered right renal cysts are seen, measuring up to 1.5 cm in size. There is no evidence of hydronephrosis. No renal or ureteral stones are seen. No perinephric stranding is appreciated.  A small amount of free fluid within the pelvis is nonspecific but may be physiologic in nature. The small bowel is unremarkable in appearance. The stomach is within normal limits. The patient's enteric tube is seen ending at the antrum of the stomach. No acute vascular abnormalities are seen.  There suggestion of mild wall thickening along the ascending colon, which could reflect a mild infectious or inflammatory process. The remainder of the colon is unremarkable in appearance. The appendix is not definitely characterized.  The bladder is significantly distended, with a Foley catheter in place. Would suggest clinical correlation as to whether the Foley catheter is clamped. The uterus is unremarkable in appearance. The ovaries are relatively symmetric. No suspicious adnexal masses are seen. No inguinal lymphadenopathy is seen.  Mild soft tissue edema is noted along the lateral abdominal wall and both flanks.  No acute osseous abnormalities are identified.  Review of the MIP images confirms the above findings.  IMPRESSION: 1. No evidence for bowel ischemia. Visualized vasculature is fully patent. No calcific atherosclerotic disease seen. 2. Persistent diffuse gallbladder wall edema, as seen on recent prior studies. Would correlate clinically to  exclude cholecystitis. 3. Mild new wall thickening along the ascending colon, which could reflect a mild acute infectious or inflammatory colitis. 4. Nonspecific small amount of free fluid noted within the pelvis; this could reflect the suggested colitis. 5. Small right renal cyst seen. 6. Significant distended bladder, with a Foley catheter in place. Would suggest clinical correlation as to whether the Foley catheter is clamped. 7. Mild soft tissue edema along the lateral abdominal wall and both flanks. 8. Minimal bibasilar opacities likely reflect atelectasis.  Electronically Signed   By: Roanna Raider M.D.   On: 02/19/2014 03:48      Medications:     Current Medications: . amiodarone      . amiodarone  150 mg Intravenous Once  . antiseptic oral rinse  15 mL Mouth Rinse QID  . chlorhexidine  15 mL Mouth Rinse BID  . heparin subcutaneous  5,000 Units Subcutaneous 3 times per day  . hydrocortisone sod succinate (SOLU-CORTEF) inj  100 mg Intravenous Q8H  . Iodine Strong (Lugols)  0.2 mL Oral TID  . pantoprazole (PROTONIX) IV  40 mg Intravenous Q24H  . piperacillin-tazobactam (ZOSYN)  IV  3.375 g Intravenous 3 times per day  . propylthiouracil  200 mg Per NG tube Q4H  . sodium chloride  2,000 mL Intravenous Once  . vancomycin  500 mg Intravenous Q12H     Infusions: . sodium chloride 20 mL/hr at 02/19/14 0700  . amiodarone    . amiodarone    . feeding supplement (VITAL AF 1.2 CAL)    . fentaNYL infusion INTRAVENOUS 100 mcg/hr (02/19/14 0900)  . midazolam (VERSED) infusion 2 mg/hr (02/19/14 0900)  . milrinone    . norepinephrine (LEVOPHED) Adult infusion 12 mcg/min (02/19/14 0530)  . phenylephrine (NEO-SYNEPHRINE) Adult infusion    . vasopressin (PITRESSIN) infusion - *FOR SHOCK*        Assessment:  1. Acute Respiratory Failure- Intubated 2. Cardiogenic Shock 3.  Cardiac arrest - S/P PEA  4. V Tach 5. HIV (CD4 28) 6. Hyperthyroidism  7. Acute Systolic Heart Failure EF 10%     Plan/Discussion:    See MD note below  Length of Stay: 1  CLEGG,AMY 02/19/2014, 3:30 PM  Advanced Heart Failure Team Pager [phone] (M-F; 7a - 4p)  Please contact [facility] for night-coverage after hours (4p -7a ) and weekends on [email or web address]  Patient seen and examined with Michelle Becket, NP. We discussed all aspects of the encounter.  43 y/o woman with AIDS (CD4 28) admitted with cardiogenic shock in setting of hyperthyroidism.  ECHO reviewed EF < 15%. She is now s/p cardiac arrest x 3. On exam she has profound shock requiring high-dose pressors with summation gallop and cool extremities. 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 Her prognosis is very poor but given her young age and potentially reversible substrate (hyperthyroidism) I feel it is reasonable to be aggressive up front. We will place emergent IABP at bedside for support. Continue levophed. Add milrinone. May need Swan to help guide therapy but for now will use co-ox and CVPs.   D/w Dr Toney Reil.   The patient is critically ill with multiple organ systems failure and requires high complexity decision making for assessment and support, frequent evaluation and titration of therapies, application of advanced monitoring technologies and extensive interpretation of multiple databases.   Critical Care Time personally devoted to patient care services described in this note is 45 Minutes.(not including time to place IABP)  Truman Hayward 5:48 PM

## 2014-02-19 NOTE — Progress Notes (Signed)
INITIAL NUTRITION ASSESSMENT  DOCUMENTATION CODES Per approved criteria  -Severe malnutrition in the context of chronic illness   INTERVENTION:  Utilize 36M PEPuP Protocol: initiate TF via OGT with Vital AF 1.2 at 25 ml/h on day 1; on day 2, increase to goal rate of 45 ml/h (1080 ml per day) to provide 1296 kcals, 81 gm protein, 876 ml free water daily.  NUTRITION DIAGNOSIS: Malnutrition related to inadequate oral intake with chronic catabolic illness as evidenced by severe depletion of muscle mass with >10% weight loss within the past 3 months.   Goal: Intake to meet >90% of estimated nutrition needs.  Monitor:  TF tolerance/adequacy, weight trend, labs, vent status.  Reason for Assessment: MD Consult for TF initiation and management.  43 y.o. female  Admitting Dx: Acute respiratory failure  ASSESSMENT: 43 yo HIV+ ( followed by Dr. Orvan Falconerampbell ) [facility] with nausea, vomiting and abdominal pain. Found to have elevated bilirubin. Today developed dyspnea, hypoxia and bilateral pulmonary infiltrates. Noted to be severely acidotic. Intubated. Hypotensive post-intubation requiring vasopressors.  Per H&P, patient has lost over 30 pounds in the last two months. Noted hypokalemia may be falsely low due to blood pH 7.7. Patient is at risk for refeeding syndrome given severe malnutrition.  Nutrition Focused Physical Exam:  Subcutaneous Fat:  Orbital Region: WNL Upper Arm Region: WNL Thoracic and Lumbar Region: NA  Muscle:  Temple Region: severe depletion Clavicle Bone Region: severe depletion Clavicle and Acromion Bone Region: severe depletion Scapular Bone Region: NA Dorsal Hand: NA Patellar Region: WNL Anterior Thigh Region: WNL Posterior Calf Region: WNL  Edema: mild  Pt meets criteria for severe MALNUTRITION in the context of chronic illness as evidenced by severe depletion of muscle mass with >10% weight loss within the past 3 months.  Patient is  currently intubated on ventilator support MV: 12.1, 5.5 L/min Temp (24hrs), Avg:98.4 F (36.9 C), Min:98.1 F (36.7 C), Max:98.9 F (37.2 C)   Height: Ht Readings from Last 1 Encounters:  03/04/2014 5\' 5"  (1.651 m)    Weight: Wt Readings from Last 1 Encounters:  02/19/14 111 lb 5.3 oz (50.5 kg)    Ideal Body Weight: 56.8 kg  % Ideal Body Weight: 89%  Wt Readings from Last 10 Encounters:  02/19/14 111 lb 5.3 oz (50.5 kg)  02/06/14 112 lb (50.803 kg)  01/02/14 124 lb 8 oz (56.473 kg)  05/02/13 166 lb (75.297 kg)  03/08/13 166 lb (75.297 kg)  01/26/13 166 lb (75.297 kg)  12/27/12 166 lb 8 oz (75.524 kg)  09/13/12 174 lb 4 oz (79.039 kg)  06/28/12 180 lb (81.647 kg)  12/22/11 167 lb 12 oz (76.091 kg)    Usual Body Weight: 124 lb (1.5 months ago)  % Usual Body Weight: 89.5%  BMI:  Body mass index is 18.53 kg/(m^2).  Estimated Nutritional Needs: Kcal: 1298 Protein: 80-90 gm Fluid: 1.5-1.7 L  Skin: intact  Diet Order: NPO  EDUCATION NEEDS: -Education not appropriate at this time   Intake/Output Summary (Last 24 hours) at 02/19/14 1049 Last data filed at 02/19/14 0900  Gross per 24 hour  Intake 1811.42 ml  Output   2650 ml  Net -838.58 ml    Last BM: PTA   Labs:   Recent Labs Lab 02/09/2014 1950 02/19/14 0420  NA 140 136*  K 4.4 3.1*  CL 95* 93*  CO2 13* 24  BUN 21 21  CREATININE 1.36* 1.04  CALCIUM 8.7 8.2*  MG 1.9 1.5  PHOS  5.8*  --   GLUCOSE 98 146*    CBG (last 3)   Recent Labs  02-19-2014 1951  GLUCAP 73    Scheduled Meds: . antiseptic oral rinse  15 mL Mouth Rinse QID  . chlorhexidine  15 mL Mouth Rinse BID  . furosemide  40 mg Intravenous Q6H  . heparin subcutaneous  5,000 Units Subcutaneous 3 times per day  . hydrocortisone sod succinate (SOLU-CORTEF) inj  100 mg Intravenous Q8H  . Iodine Strong (Lugols)  0.2 mL Oral TID  . metroNIDAZOLE  250 mg Oral 3 times per day  . pantoprazole (PROTONIX) IV  40 mg Intravenous Q24H  .  piperacillin-tazobactam (ZOSYN)  IV  3.375 g Intravenous 3 times per day  . propylthiouracil  200 mg Per NG tube Q4H  . vancomycin  500 mg Intravenous Q12H    Continuous Infusions: . fentaNYL infusion INTRAVENOUS 100 mcg/hr (02/19/14 0900)  . midazolam (VERSED) infusion 2 mg/hr (02/19/14 0900)  . norepinephrine (LEVOPHED) Adult infusion 12 mcg/min (02/19/14 0530)    No past medical history on file.  No past surgical history on file.   Joaquin Courts, RD, LDN, CNSC Pager [phone] After Hours Pager [phone]

## 2014-02-19 NOTE — Progress Notes (Signed)
Changes per Dr.McQuaid per ABG.

## 2014-02-19 NOTE — Progress Notes (Signed)
Chaplains responded to both Code Blues. Consulted with RN's. No family present. Available for support to family, if needed.   Maurene Capes [phone]

## 2014-02-19 NOTE — H&P (Addendum)
Arterial Catheter Insertion Procedure Note Michelle Flores [id number] 1970/11/05  Procedure: Insertion of Arterial Catheter  Indications: Blood pressure monitoring and Frequent blood sampling  Procedure Details Consent: Unable to obtain consent because of altered level of consciousness. Time Out: Verified patient identification, verified procedure, site/side was marked, verified correct patient position, special equipment/implants available, medications/allergies/relevent history reviewed, required imaging and test results available.  Performed  Maximum sterile technique was used including antiseptics, cap, gloves, gown, hand hygiene, mask and sheet. Skin prep: Chlorhexidine; local anesthetic administered 22 gauge catheter was inserted into right femoral artery using the Seldinger technique. Procedure done under direct visualization with ultrasound.   Evaluation Blood flow good; BP tracing good. Complications: No apparent complications.   [name] 02/19/2014

## 2014-02-19 NOTE — Progress Notes (Signed)
[facility] ADULT ICU REPLACEMENT PROTOCOL FOR AM LAB REPLACEMENT ONLY  The patient does not apply for the [facility] Adult ICU Electrolyte Replacment Protocol based on the criteria listed below:    Is urine output >/= 0.5 ml/kg/hr for the last 6 hours? no Patient's UOP is 0 ml/kg/hr   Abnormal electrolyte(s): K3.1   If a panic level lab has been reported, has the CCM MD in charge been notified? yes.   Physician:  Leandrew Koyanagi, MD  Melrose Nakayama 02/19/2014 5:28 AM

## 2014-02-19 NOTE — Progress Notes (Signed)
Pt PEA arrest; see code sheets; Dr Jones Broom at bedside for IABP placement at 1500; Report given to cath lab team, and they assumed patient care; Report called to Aggie Cosier, RN on [facility]; pt to be transferred via bed to 2H04.  Burnard Bunting, RN

## 2014-02-19 NOTE — Progress Notes (Signed)
eLink Physician-Brief Progress Note Patient Name: KHELSEY KEPLER DOB: 07-11-1971 MRN: [id number]  Date of Service  02/19/2014   HPI/Events of Note  Hypokalemia > lab obtained when pH was 7.7, so likely falsely low   eICU Interventions  Repeat later this morning 1000   Intervention Category Minor Interventions: Electrolytes abnormality - evaluation and management  Mirenda Baltazar 02/19/2014, 6:12 AM

## 2014-02-19 NOTE — Progress Notes (Addendum)
Cath lab team called to room by Dr. Teressa Lower for bedside insertion of IABP. Left groin prepped in sterile fashion. Utilizing sterile technique left femoral arterial accessed successfully and a 53F sheath inserted under fluoroscopy. Sheath exchanged for a 93F IAB sheath and a 34cc balloon pump catheter inserted without complication. Placement verified under fluoro. Sheath secured by suture and statlock. Sterile dressing applied. Augmentation was 131. No complications noted at this time.

## 2014-02-19 NOTE — CV Procedure (Signed)
Cardiac Cath Procedure Note:  Indication: Cardiogenic shock, cardiac arrest  Procedures performed:  1) IABP placement  Description of procedure:   The risks and indication of the procedure were explained to her chaplain at bedside (she has no family except young children). Consent was signed and placed on the chart. An appropriate timeout was taken prior to the procedure. The left groin was prepped and draped in the routine sterile fashion and anesthetized with 1% local lidocaine. The left femoral artery was identified using u/s guidance.   A 6FR sheath was initially placed in the L femoral artery. This was then changed ova a wire for a 7.5 FR IABP sheath. Under fluoroscopic guidance, a 34 cm IABP was placed. Once appropriate position was confirmed the IABP was flushed and then was sutured in place.  Complications:  None apparent  Truman Hayward 5:51 PM

## 2014-02-19 NOTE — Progress Notes (Signed)
eLink Physician-Brief Progress Note Patient Name: Michelle Flores DOB: 1970-11-12 MRN: [id number]  Date of Service  02/19/2014   HPI/Events of Note   Frequent VTach QTc per monitor 612 On Levaquin ABG: pH 7.7 CT abdomen> colitis  eICU Interventions  Decrease MV now > decreased TVol to 7cc/kg, decrease RR to 12 12 lead EKG D/c levaquin Add flagyl, check c.diff D/c bicarb gtt Check cbc, bmet, mg Give Mg after blood work drawn   Intervention Category Intermediate Interventions: Arrhythmia - evaluation and management  Tiarra Anastacio 02/19/2014, 4:25 AM

## 2014-02-19 NOTE — Progress Notes (Signed)
Echocardiogram 2D Echocardiogram has been performed.  Michelle Flores 02/19/2014, 11:37 AM

## 2014-02-19 NOTE — Consult Note (Signed)
[facility]  Total days of antibiotics 2        Day 2 azithro        Day 2 piptazo        Day 2 vanco        Day 2 oral metronidzole        Day 2 levo       Reason for Consult: hiv and critical illness    Referring Physician: [email or web address]  Active Problems:   Shock   Acute respiratory failure with hypoxia    HPI: Michelle Flores is a 43 y.o. female with HIV, Cd 4 count of 100(11%)/VL 191,000 Recently started back on stribild 12 days ago as confirmed by outside pharmacy, last seen by Dr. Megan Salon in early June, where she was placed on dapsone for PCP proph and started back on stribild. Now admitted on 6/14 as a transfer from OSH for N/V/Abd pain with elevated bilirubin,tbili 5.2 hypotension, EF 10% on TTE. Severe acidosis with lactic acid of 16 leading to acute respiratory distress requiring intubation and vasopressors. Critical care team managing patient for thyroid storm. TTE found to have severe global myocardial hypokinesis with EF 10%. abd pelvic ct showed GB wall edema and colitis, patient was started on azithromycin, piptazo, vanco, oral metronidazole.  Records reveal that patient 30# weight loss in the last 2 months.  No past medical history on file.  Allergies:  Allergies  Allergen Reactions  . Diphenhydramine Hcl   . Latex   . Sulfonamide Derivatives     MEDICATIONS: . antiseptic oral rinse  15 mL Mouth Rinse QID  . azithromycin  500 mg Intravenous Q24H  . chlorhexidine  15 mL Mouth Rinse BID  . furosemide  40 mg Intravenous Q6H  . heparin subcutaneous  5,000 Units Subcutaneous 3 times per day  . hydrocortisone sod succinate (SOLU-CORTEF) inj  100 mg Intravenous Q8H  . Iodine Strong (Lugols)  0.2 mL Oral TID  . metroNIDAZOLE  500 mg Oral 3 times per day  . pantoprazole (PROTONIX) IV  40 mg Intravenous Q24H  . piperacillin-tazobactam (ZOSYN)  IV  3.375 g Intravenous 3 times per day  . potassium chloride  40 mEq Per Tube Once  .  propylthiouracil  200 mg Per NG tube Q4H  . vancomycin  500 mg Intravenous Q12H    History  Substance Use Topics  . Smoking status: Never Smoker   . Smokeless tobacco: Never Used  . Alcohol Use: No    No family history on file.  Review of Systems - unable to obtain due to the patient being intupated   OBJECTIVE: Temp:  [98.1 F (36.7 C)-98.9 F (37.2 C)] 98.3 F (36.8 C) (06/15 0810) Pulse Rate:  [66-123] 105 (06/15 1145) Resp:  [12-30] 12 (06/15 1145) BP: (38-122)/(15-99) 101/70 mmHg (06/15 1145) SpO2:  [100 %] 100 % (06/15 1145) Arterial Line BP: (88-133)/(44-83) 107/63 mmHg (06/15 1100) FiO2 (%):  [40 %-100 %] 40 % (06/15 1145) Weight:  [111 lb 5.3 oz (50.5 kg)-112 lb 10.5 oz (51.1 kg)] 111 lb 5.3 oz (50.5 kg) (06/15 0400) Physical Exam  Constitutional:  Sedated, intubated. Cachetic. Chronically ill appearing No distress.  HENT: ET tube in place. Scleral icterus. Bitemporal wasting Mouth/Throat: Oropharynx appears dry Cardiovascular: tachycardic, regular rhythm and normal heart sounds. Exam reveals no gallop and no friction rub.  No murmur heard.  Pulmonary/Chest: Effort normal and breath sounds normal. No respiratory distress.  has no wheezes.  Abdominal: Soft.  Bowel sounds are decrease.  exhibits no distension. There is no tenderness. Skin: Skin is warm and dry. No rash noted. No erythema.   LABS: Results for orders placed during the hospital encounter of 02/17/2014 (from the past 48 hour(s))  LACTIC ACID, PLASMA     Status: Abnormal   Collection Time    02/05/2014  7:43 PM      Result Value Ref Range   Lactic Acid, Venous 16.9 (*) 0.5 - 2.2 mmol/L  TROPONIN I     Status: None   Collection Time    02/25/2014  7:43 PM      Result Value Ref Range   Troponin I <0.30  <0.30 ng/mL   Comment:            Due to the release kinetics of cTnI,     a negative result within the first hours     of the onset of symptoms does not rule out     myocardial infarction with  certainty.     If myocardial infarction is still suspected,     repeat the test at appropriate intervals.  CULTURE, RESPIRATORY (NON-EXPECTORATED)     Status: None   Collection Time    02/21/2014  7:43 PM      Result Value Ref Range   Specimen Description TRACHEAL ASPIRATE     Special Requests NONE     Gram Stain       Value: FEW WBC PRESENT, PREDOMINANTLY PMN     FEW SQUAMOUS EPITHELIAL CELLS PRESENT     FEW GRAM POSITIVE COCCI IN PAIRS     RARE GRAM NEGATIVE RODS     Performed at [facility]   Culture PENDING     Report Status PENDING    TSH     Status: Abnormal   Collection Time    02/05/2014  7:49 PM      Result Value Ref Range   TSH 0.008 (*) 0.350 - 4.500 uIU/mL  T4, FREE     Status: Abnormal   Collection Time    03/03/2014  7:49 PM      Result Value Ref Range   Free T4 1.82 (*) 0.80 - 1.80 ng/dL   Comment: Performed at [facility]  T3     Status: None   Collection Time    03/02/2014  7:49 PM      Result Value Ref Range   T3, Total 91.0  80.0 - 204.0 ng/dl   Comment: Performed at [facility]     Status: None   Collection Time    02/15/2014  7:49 PM      Result Value Ref Range   Cortisol, Plasma 39.5     Comment: (NOTE)     AM:  4.3 - 22.4 ug/dL     PM:  3.1 - 16.7 ug/dL     Performed at [facility]  CBC WITH DIFFERENTIAL     Status: Abnormal   Collection Time    02/05/2014  7:50 PM      Result Value Ref Range   WBC 12.8 (*) 4.0 - 10.5 K/uL   RBC 3.86 (*) 3.87 - 5.11 MIL/uL   Hemoglobin 11.3 (*) 12.0 - 15.0 g/dL   HCT 33.9 (*) 36.0 - 46.0 %   MCV 87.8  78.0 - 100.0 fL   MCH 29.3  26.0 - 34.0 pg   MCHC 33.3  30.0 - 36.0 g/dL   RDW 20.4 (*) 11.5 -  15.5 %   Platelets 121 (*) 150 - 400 K/uL   Neutrophils Relative % 86 (*) 43 - 77 %   Lymphocytes Relative 7 (*) 12 - 46 %   Monocytes Relative 6  3 - 12 %   Eosinophils Relative 0  0 - 5 %   Basophils Relative 1  0 - 1 %   Neutro Abs 11.0 (*) 1.7 - 7.7 K/uL   Lymphs Abs 0.9   0.7 - 4.0 K/uL   Monocytes Absolute 0.8  0.1 - 1.0 K/uL   Eosinophils Absolute 0.0  0.0 - 0.7 K/uL   Basophils Absolute 0.1  0.0 - 0.1 K/uL   RBC Morphology POLYCHROMASIA PRESENT     WBC Morphology ATYPICAL LYMPHOCYTES    COMPREHENSIVE METABOLIC PANEL     Status: Abnormal   Collection Time    02/15/2014  7:50 PM      Result Value Ref Range   Sodium 140  137 - 147 mEq/L   Potassium 4.4  3.7 - 5.3 mEq/L   Chloride 95 (*) 96 - 112 mEq/L   CO2 13 (*) 19 - 32 mEq/L   Glucose, Bld 98  70 - 99 mg/dL   BUN 21  6 - 23 mg/dL   Creatinine, Ser 1.36 (*) 0.50 - 1.10 mg/dL   Calcium 8.7  8.4 - 10.5 mg/dL   Total Protein 7.3  6.0 - 8.3 g/dL   Albumin 2.3 (*) 3.5 - 5.2 g/dL   AST 72 (*) 0 - 37 U/L   ALT 23  0 - 35 U/L   Alkaline Phosphatase 134 (*) 39 - 117 U/L   Total Bilirubin 5.2 (*) 0.3 - 1.2 mg/dL   GFR calc non Af Amer 48 (*) >90 mL/min   GFR calc Af Amer 55 (*) >90 mL/min   Comment: (NOTE)     The eGFR has been calculated using the CKD EPI equation.     This calculation has not been validated in all clinical situations.     eGFR's persistently <90 mL/min signify possible Chronic Kidney     Disease.  MAGNESIUM     Status: None   Collection Time    02/15/2014  7:50 PM      Result Value Ref Range   Magnesium 1.9  1.5 - 2.5 mg/dL  PHOSPHORUS     Status: Abnormal   Collection Time    03/04/2014  7:50 PM      Result Value Ref Range   Phosphorus 5.8 (*) 2.3 - 4.6 mg/dL  PROTIME-INR     Status: Abnormal   Collection Time    02/28/2014  7:50 PM      Result Value Ref Range   Prothrombin Time 24.7 (*) 11.6 - 15.2 seconds   INR 2.32 (*) 0.00 - 1.49  APTT     Status: None   Collection Time    02/21/2014  7:50 PM      Result Value Ref Range   aPTT 32  24 - 37 seconds  PRO B NATRIURETIC PEPTIDE     Status: Abnormal   Collection Time    02/26/2014  7:50 PM      Result Value Ref Range   Pro B Natriuretic peptide (BNP) 20863.0 (*) 0 - 125 pg/mL  GLUCOSE, CAPILLARY     Status: None   Collection  Time    02/23/2014  7:51 PM      Result Value Ref Range   Glucose-Capillary 73  70 - 99 mg/dL  CARBOXYHEMOGLOBIN     Status: Abnormal   Collection Time    02/21/2014  8:03 PM      Result Value Ref Range   Total hemoglobin 11.5 (*) 12.0 - 16.0 g/dL   O2 Saturation 62.6     Carboxyhemoglobin 1.3  0.5 - 1.5 %   Methemoglobin 0.9  0.0 - 1.5 %  MRSA PCR SCREENING     Status: None   Collection Time    02/25/2014  8:15 PM      Result Value Ref Range   MRSA by PCR NEGATIVE  NEGATIVE   Comment:            The GeneXpert MRSA Assay (FDA     approved for NASAL specimens     only), is one component of a     comprehensive MRSA colonization     surveillance program. It is not     intended to diagnose MRSA     infection nor to guide or     monitor treatment for     MRSA infections.  URINALYSIS, ROUTINE W REFLEX MICROSCOPIC     Status: Abnormal   Collection Time    02/27/2014  8:42 PM      Result Value Ref Range   Color, Urine ORANGE (*) YELLOW   Comment: BIOCHEMICALS MAY BE AFFECTED BY COLOR   APPearance TURBID (*) CLEAR   Specific Gravity, Urine 1.030  1.005 - 1.030   pH 5.5  5.0 - 8.0   Glucose, UA NEGATIVE  NEGATIVE mg/dL   Hgb urine dipstick LARGE (*) NEGATIVE   Bilirubin Urine MODERATE (*) NEGATIVE   Ketones, ur 15 (*) NEGATIVE mg/dL   Protein, ur 100 (*) NEGATIVE mg/dL   Urobilinogen, UA 2.0 (*) 0.0 - 1.0 mg/dL   Nitrite NEGATIVE  NEGATIVE   Leukocytes, UA TRACE (*) NEGATIVE  URINE MICROSCOPIC-ADD ON     Status: Abnormal   Collection Time    02/15/2014  8:42 PM      Result Value Ref Range   Squamous Epithelial / LPF RARE  RARE   WBC, UA 3-6  <3 WBC/hpf   RBC / HPF 7-10  <3 RBC/hpf   Bacteria, UA MANY (*) RARE   Casts GRANULAR CAST (*) NEGATIVE   Comment: HYALINE CASTS  POCT I-STAT 3, ART BLOOD GAS (G3+)     Status: Abnormal   Collection Time    02/15/2014 11:03 PM      Result Value Ref Range   pH, Arterial 7.559 (*) 7.350 - 7.450   pCO2 arterial 22.2 (*) 35.0 - 45.0 mmHg   pO2,  Arterial 560.0 (*) 80.0 - 100.0 mmHg   Bicarbonate 19.8 (*) 20.0 - 24.0 mEq/L   TCO2 21  0 - 100 mmol/L   O2 Saturation 100.0     Acid-base deficit 1.0  0.0 - 2.0 mmol/L   Patient temperature 98.1 F     Collection site FEMORAL ARTERY     Sample type ARTERIAL    TROPONIN I     Status: None   Collection Time    02/19/14 12:08 AM      Result Value Ref Range   Troponin I <0.30  <0.30 ng/mL   Comment:            Due to the release kinetics of cTnI,     a negative result within the first hours     of the onset of symptoms does not rule out     myocardial infarction  with certainty.     If myocardial infarction is still suspected,     repeat the test at appropriate intervals.  LACTIC ACID, PLASMA     Status: Abnormal   Collection Time    02/19/14 12:20 AM      Result Value Ref Range   Lactic Acid, Venous 9.7 (*) 0.5 - 2.2 mmol/L  CARBOXYHEMOGLOBIN     Status: Abnormal   Collection Time    02/19/14 12:33 AM      Result Value Ref Range   Total hemoglobin 11.7 (*) 12.0 - 16.0 g/dL   O2 Saturation 58.6     Carboxyhemoglobin 1.5  0.5 - 1.5 %   Methemoglobin 0.8  0.0 - 1.5 %  BLOOD GAS, ARTERIAL     Status: Abnormal   Collection Time    02/19/14  4:05 AM      Result Value Ref Range   FIO2 0.60     Delivery systems VENTILATOR     Mode PRESSURE REGULATED VOLUME CONTROL     VT 500     Rate 24     Peep/cpap 5.0     pH, Arterial 7.715 (*) 7.350 - 7.450   Comment: CRITICAL RESULT CALLED TO, READ BACK BY AND VERIFIED WITH:     CHRISTINE ROYAL,RN AT 0414,BY TIM Geanie Pacifico RRT,RCP ON 02/19/14   pCO2 arterial 19.9 (*) 35.0 - 45.0 mmHg   Comment: CRITICAL RESULT CALLED TO, READ BACK BY AND VERIFIED WITH:     CHRISTINE ROYAL,RN AT 0414,BY TIM Irvan Tiedt RRT,RCP ON 02/19/14   pO2, Arterial 332.0 (*) 80.0 - 100.0 mmHg   Bicarbonate 26.3 (*) 20.0 - 24.0 mEq/L   TCO2 26.9  0 - 100 mmol/L   Acid-Base Excess 5.3 (*) 0.0 - 2.0 mmol/L   O2 Saturation 100.0     Patient temperature 98.3     Collection site  A-LINE     Drawn by COLLECTED BY RT     Sample type ARTERIAL DRAW     Allens test (pass/fail) PASS  PASS  CBC     Status: Abnormal   Collection Time    02/19/14  4:20 AM      Result Value Ref Range   WBC 8.6  4.0 - 10.5 K/uL   RBC 4.31  3.87 - 5.11 MIL/uL   Hemoglobin 12.4  12.0 - 15.0 g/dL   HCT 36.1  36.0 - 46.0 %   MCV 83.8  78.0 - 100.0 fL   MCH 28.8  26.0 - 34.0 pg   MCHC 34.3  30.0 - 36.0 g/dL   RDW 19.9 (*) 11.5 - 15.5 %   Platelets    150 - 400 K/uL   Value: PLATELET CLUMPS NOTED ON SMEAR, COUNT APPEARS DECREASED  BASIC METABOLIC PANEL     Status: Abnormal   Collection Time    02/19/14  4:20 AM      Result Value Ref Range   Sodium 136 (*) 137 - 147 mEq/L   Potassium 3.1 (*) 3.7 - 5.3 mEq/L   Comment: DELTA CHECK NOTED   Chloride 93 (*) 96 - 112 mEq/L   CO2 24  19 - 32 mEq/L   Glucose, Bld 146 (*) 70 - 99 mg/dL   BUN 21  6 - 23 mg/dL   Creatinine, Ser 1.04  0.50 - 1.10 mg/dL   Calcium 8.2 (*) 8.4 - 10.5 mg/dL   GFR calc non Af Amer 66 (*) >90 mL/min   GFR calc Af Amer 76 (*) >  90 mL/min   Comment: (NOTE)     The eGFR has been calculated using the CKD EPI equation.     This calculation has not been validated in all clinical situations.     eGFR's persistently <90 mL/min signify possible Chronic Kidney     Disease.  MAGNESIUM     Status: None   Collection Time    02/19/14  4:20 AM      Result Value Ref Range   Magnesium 1.5  1.5 - 2.5 mg/dL  LACTIC ACID, PLASMA     Status: None   Collection Time    02/19/14  7:43 AM      Result Value Ref Range   Lactic Acid, Venous 2.0  0.5 - 2.2 mmol/L  TROPONIN I     Status: None   Collection Time    02/19/14  7:43 AM      Result Value Ref Range   Troponin I <0.30  <0.30 ng/mL   Comment:            Due to the release kinetics of cTnI,     a negative result within the first hours     of the onset of symptoms does not rule out     myocardial infarction with certainty.     If myocardial infarction is still suspected,      repeat the test at appropriate intervals.  BASIC METABOLIC PANEL     Status: Abnormal   Collection Time    02/19/14 10:00 AM      Result Value Ref Range   Sodium 139  137 - 147 mEq/L   Potassium 3.5 (*) 3.7 - 5.3 mEq/L   Chloride 95 (*) 96 - 112 mEq/L   CO2 30  19 - 32 mEq/L   Glucose, Bld 133 (*) 70 - 99 mg/dL   BUN 22  6 - 23 mg/dL   Creatinine, Ser 1.03  0.50 - 1.10 mg/dL   Calcium 8.0 (*) 8.4 - 10.5 mg/dL   GFR calc non Af Amer 67 (*) >90 mL/min   GFR calc Af Amer 77 (*) >90 mL/min   Comment: (NOTE)     The eGFR has been calculated using the CKD EPI equation.     This calculation has not been validated in all clinical situations.     eGFR's persistently <90 mL/min signify possible Chronic Kidney     Disease.    MICRO:  IMAGING: Dg Chest Port 1 View  03/03/2014   CLINICAL DATA:  Central line placement.  EXAM: PORTABLE CHEST - 1 VIEW  COMPARISON:  Several exams earlier today.  FINDINGS: Cardiomegaly remains present. Enteric tube and endotracheal tube unchanged. Interval placement of left subclavian central line with the tip in the mid SVC. No pneumothorax.  IMPRESSION: Uncomplicated left subclavian central line placement. Unchanged cardiomegaly and other support apparatus.   Electronically Signed   By: Dereck Ligas M.D.   On: 03/02/2014 22:44   Dg Chest Port 1 View  02/26/2014   CLINICAL DATA:  Endotracheal tube placement. Postprocedural radiograph.  EXAM: PORTABLE CHEST - 1 VIEW  COMPARISON:  02/10/2014 at 1805 hr.  FINDINGS: The endotracheal tube is unchanged. Interval introduction of an enteric tube with the tip not visualized. Redundant loop in the gastric fundus. Cardiomegaly remains present. Monitoring leads project over the chest. No airspace disease. No effusion.  IMPRESSION: Interval placement of enteric tube. Endotracheal tube unchanged. Unchanged cardiomegaly.   Electronically Signed   By: Arvilla Market.D.  On: 02/16/2014 20:21   Ct Angio Abd/pel W/ And/or  W/o  02/19/2014   CLINICAL DATA:  Elevated lactic acid. Shock. Assess for bowel ischemia.  EXAM: CTA ABDOMEN AND PELVIS WITHOUT AND WITH CONTRAST  TECHNIQUE: Multidetector CT imaging of the abdomen and pelvis was performed using the standard protocol during bolus administration of intravenous contrast. Multiplanar reconstructed images and MIPs were obtained and reviewed to evaluate the vascular anatomy.  CONTRAST:  157m OMNIPAQUE IOHEXOL 350 MG/ML SOLN  COMPARISON:  CT of the abdomen and pelvis performed 02/17/2014, and abdominal ultrasound performed 02/25/2014  FINDINGS: No calcific atherosclerotic disease is seen. There is no evidence of luminal narrowing. The abdominal aorta is unremarkable in appearance. The celiac trunk, superior mesenteric artery, bilateral renal arteries and inferior mesenteric artery are unremarkable in appearance. There is no evidence of occlusion. Visualized mesenteric vessels remain fully patent.  Minimal bibasilar opacities likely reflect atelectasis.  The liver and spleen are unremarkable in appearance. There is persistent diffuse gallbladder wall edema, with vicarious contrast excretion noted in the gallbladder. The pancreas and adrenal glands are unremarkable.  Scattered right renal cysts are seen, measuring up to 1.5 cm in size. There is no evidence of hydronephrosis. No renal or ureteral stones are seen. No perinephric stranding is appreciated.  A small amount of free fluid within the pelvis is nonspecific but may be physiologic in nature. The small bowel is unremarkable in appearance. The stomach is within normal limits. The patient's enteric tube is seen ending at the antrum of the stomach. No acute vascular abnormalities are seen.  There suggestion of mild wall thickening along the ascending colon, which could reflect a mild infectious or inflammatory process. The remainder of the colon is unremarkable in appearance. The appendix is not definitely characterized.  The bladder is  significantly distended, with a Foley catheter in place. Would suggest clinical correlation as to whether the Foley catheter is clamped. The uterus is unremarkable in appearance. The ovaries are relatively symmetric. No suspicious adnexal masses are seen. No inguinal lymphadenopathy is seen.  Mild soft tissue edema is noted along the lateral abdominal wall and both flanks.  No acute osseous abnormalities are identified.  Review of the MIP images confirms the above findings.  IMPRESSION: 1. No evidence for bowel ischemia. Visualized vasculature is fully patent. No calcific atherosclerotic disease seen. 2. Persistent diffuse gallbladder wall edema, as seen on recent prior studies. Would correlate clinically to exclude cholecystitis. 3. Mild new wall thickening along the ascending colon, which could reflect a mild acute infectious or inflammatory colitis. 4. Nonspecific small amount of free fluid noted within the pelvis; this could reflect the suggested colitis. 5. Small right renal cyst seen. 6. Significant distended bladder, with a Foley catheter in place. Would suggest clinical correlation as to whether the Foley catheter is clamped. 7. Mild soft tissue edema along the lateral abdominal wall and both flanks. 8. Minimal bibasilar opacities likely reflect atelectasis.   Electronically Signed   By: JGarald BaldingM.D.   On: 02/19/2014 03:48    Assessment/Plan:  48yoF with advanced HIV/AIDS admitted for severe metabolic acidosis with symptoms of N/V and also found to have symptoms concerning for thyroid toxicosis. She recently started on stribild.  - tenofovir (a component of stribild) has been associated with metabolic acidosis agree with temporary discontinuation of hiv medication - gall bladder wall edema and ascending colon wall thickening maybe due to infection. Agree with piptazo for now. Recommend to discontinue oral metronidazole. Less likely to  be cdifficile. - recommend to discontinue vancomycin  tomorrow if blood cx remain negative. - will check indirect bilirubin to see if due to hemolysis - cmv viral load pending as other etiology for cmv colitis - once stabilized will start on mepron for OI proph - will check viral load  Malaika Arnall B. [facility] [phone]

## 2014-02-19 NOTE — H&P (Signed)
PULMONARY / CRITICAL CARE MEDICINE  Name: Michelle CurtisOdella P Flores MRN: [id number] DOB: 06/05/1971    ADMISSION DATE:  02/23/2014 CONSULTATION DATE:  02/17/2014  REFERRING MD :  EDP PRIMARY SERVICE:  PCCM  CHIEF COMPLAINT:  Acute respiratory failure  BRIEF PATIENT DESCRIPTION: 43 yo HIV+ ( followed by Dr. Orvan Falconerampbell ) presented to [facility] with nausea, vomiting and abdominal pain. Found to have elevated bilirubin. Today developed dyspnea, hypoxia and bilateral pulmonary infiltrates.  Noted to be severely acidotic.  Intubated.  Hypotensive post-intubation requiring vasopressors.  SIGNIFICANT EVENTS / STUDIES:  6/14  [facility] with bilateral pulmonary infiltrates, hypoxemic respiratory failure, hypotension 6/14  Bedside TTE >>> severe global myocardial hypokinesis  6/25  Abd / pelv CT >>> Gallbladder wall edema, colitis 6/15  TTE >>>  LINES / TUBES: OETT 6/14 >>> OGT 6/14 >>> Foley 6/14 >>> Left subclavian CVL 6/14 >>>  CULTURES: 6/14 Blood >>> 6/14 Urine >>> 6/14 Respiratory >>>  ANTIBIOTICS: Zosyn 6/14 >>> Vancomycin 6/14 >>> Levaquin 6/14 x 1 Flagyl 6/14 >>> Azithromycin 6/15 >>>  INTERVAL HISTORY:  No acute events overnight  VITAL SIGNS: Temp:  [98.1 F (36.7 C)-98.9 F (37.2 C)] 98.3 F (36.8 C) (06/15 0810) Pulse Rate:  [66-123] 114 (06/15 0738) Resp:  [12-30] 12 (06/15 0738) BP: (38-122)/(15-99) 96/72 mmHg (06/15 0738) SpO2:  [100 %] 100 % (06/15 0738) Arterial Line BP: (88-133)/(44-83) 105/69 mmHg (06/15 0700) FiO2 (%):  [40 %-100 %] 40 % (06/15 0800) Weight:  [50.5 kg (111 lb 5.3 oz)-51.1 kg (112 lb 10.5 oz)] 50.5 kg (111 lb 5.3 oz) (06/15 0400)  HEMODYNAMICS: CVP:  [7 mmHg-11 mmHg] 7 mmHg  VENTILATOR SETTINGS: Vent Mode:  [-] PRVC FiO2 (%):  [40 %-100 %] 40 % Set Rate:  [12 bmp-30 bmp] 12 bmp Vt Set:  [400 mL-500 mL] 400 mL PEEP:  [5 cmH20] 5 cmH20 Plateau Pressure:  [13 cmH20-18 cmH20] 13 cmH20  INTAKE /  OUTPUT: Intake/Output     06/14 0701 - 06/15 0700 06/15 0701 - 06/16 0700   I.V. (mL/kg) 1358.3 (26.9) 43.1 (0.9)   NG/GT 120 90   IV Piggyback 75 125   Total Intake(mL/kg) 1553.3 (30.8) 258.1 (5.1)   Urine (mL/kg/hr) 2450 200 (1)   Total Output 2450 200   Net -896.7 +58.1          PHYSICAL EXAMINATION: General:  Appears acutely ill, mechanically ventilated, synchronous Neuro:  Sedated, nonfocal HEENT:  PERRL, OETT / OGT, scleral icterus Cardiovascular:  RRR, no m/r/g Lungs:  Bilateral air entry, rales Abdomen:  Soft, nontender, bowel sounds diminished Musculoskeletal:  Moves all extremities, edema Skin:  Intact  LABS: CBC  Recent Labs Lab 03/03/2014 1950 02/19/14 0420  WBC 12.8* 8.6  HGB 11.3* 12.4  HCT 33.9* 36.1  PLT 121* PLATELET CLUMPS NOTED ON SMEAR, COUNT APPEARS DECREASED   Coag's  Recent Labs Lab 03/03/2014 1950  APTT 32  INR 2.32*   BMET  Recent Labs Lab 02/15/2014 1950 02/19/14 0420  NA 140 136*  K 4.4 3.1*  CL 95* 93*  CO2 13* 24  BUN 21 21  CREATININE 1.36* 1.04  GLUCOSE 98 146*   Electrolytes  Recent Labs Lab 02/12/2014 1950 02/19/14 0420  CALCIUM 8.7 8.2*  MG 1.9 1.5  PHOS 5.8*  --    Sepsis Markers  Recent Labs Lab 02/28/2014 1943 02/19/14 0020  LATICACIDVEN 16.9* 9.7*   ABG  Recent Labs Lab 02/23/2014 2303 02/19/14 0405  PHART 7.559* 7.715*  PCO2ART 22.2*  19.9*  PO2ART 560.0* 332.0*   Liver Enzymes  Recent Labs Lab 02/09/2014 1950  AST 72*  ALT 23  ALKPHOS 134*  BILITOT 5.2*  ALBUMIN 2.3*   Cardiac Enzymes  Recent Labs Lab 02/17/2014 1943 02/09/2014 1950 02/19/14 0008  TROPONINI <0.30  --  <0.30  PROBNP  --  20863.0*  --    Glucose  Recent Labs Lab 02/09/2014 1951  GLUCAP 73   IMAGING:  Dg Chest Port 1 View  02/21/2014   CLINICAL DATA:  Central line placement.  EXAM: PORTABLE CHEST - 1 VIEW  COMPARISON:  Several exams earlier today.  FINDINGS: Cardiomegaly remains present. Enteric tube and endotracheal  tube unchanged. Interval placement of left subclavian central line with the tip in the mid SVC. No pneumothorax.  IMPRESSION: Uncomplicated left subclavian central line placement. Unchanged cardiomegaly and other support apparatus.   Electronically Signed   By: Andreas Newport M.D.   On: 02/21/2014 22:44   Dg Chest Port 1 View  02/23/2014   CLINICAL DATA:  Endotracheal tube placement. Postprocedural radiograph.  EXAM: PORTABLE CHEST - 1 VIEW  COMPARISON:  02/12/2014 at 1805 hr.  FINDINGS: The endotracheal tube is unchanged. Interval introduction of an enteric tube with the tip not visualized. Redundant loop in the gastric fundus. Cardiomegaly remains present. Monitoring leads project over the chest. No airspace disease. No effusion.  IMPRESSION: Interval placement of enteric tube. Endotracheal tube unchanged. Unchanged cardiomegaly.   Electronically Signed   By: Andreas Newport M.D.   On: 02/10/2014 20:21   Ct Angio Abd/pel W/ And/or W/o  02/19/2014   CLINICAL DATA:  Elevated lactic acid. Shock. Assess for bowel ischemia.  EXAM: CTA ABDOMEN AND PELVIS WITHOUT AND WITH CONTRAST  TECHNIQUE: Multidetector CT imaging of the abdomen and pelvis was performed using the standard protocol during bolus administration of intravenous contrast. Multiplanar reconstructed images and MIPs were obtained and reviewed to evaluate the vascular anatomy.  CONTRAST:  OMNIPAQUE IOHEXOL 350 MG/ML SOLN  COMPARISON:  CT of the abdomen and pelvis performed 02/17/2014, and abdominal ultrasound performed 03/06/2014  FINDINGS: No calcific atherosclerotic disease is seen. There is no evidence of luminal narrowing. The abdominal aorta is unremarkable in appearance. The celiac trunk, superior mesenteric artery, bilateral renal arteries and inferior mesenteric artery are unremarkable in appearance. There is no evidence of occlusion. Visualized mesenteric vessels remain fully patent.  Minimal bibasilar opacities likely reflect atelectasis.   The liver and spleen are unremarkable in appearance. There is persistent diffuse gallbladder wall edema, with vicarious contrast excretion noted in the gallbladder. The pancreas and adrenal glands are unremarkable.  Scattered right renal cysts are seen, measuring up to 1.5 cm in size. There is no evidence of hydronephrosis. No renal or ureteral stones are seen. No perinephric stranding is appreciated.  A small amount of free fluid within the pelvis is nonspecific but may be physiologic in nature. The small bowel is unremarkable in appearance. The stomach is within normal limits. The patient's enteric tube is seen ending at the antrum of the stomach. No acute vascular abnormalities are seen.  There suggestion of mild wall thickening along the ascending colon, which could reflect a mild infectious or inflammatory process. The remainder of the colon is unremarkable in appearance. The appendix is not definitely characterized.  The bladder is significantly distended, with a Foley catheter in place. Would suggest clinical correlation as to whether the Foley catheter is clamped. The uterus is unremarkable in appearance. The ovaries are relatively symmetric. No suspicious  adnexal masses are seen. No inguinal lymphadenopathy is seen.  Mild soft tissue edema is noted along the lateral abdominal wall and both flanks.  No acute osseous abnormalities are identified.  Review of the MIP images confirms the above findings.  IMPRESSION: 1. No evidence for bowel ischemia. Visualized vasculature is fully patent. No calcific atherosclerotic disease seen. 2. Persistent diffuse gallbladder wall edema, as seen on recent prior studies. Would correlate clinically to exclude cholecystitis. 3. Mild new wall thickening along the ascending colon, which could reflect a mild acute infectious or inflammatory colitis. 4. Nonspecific small amount of free fluid noted within the pelvis; this could reflect the suggested colitis. 5. Small right renal  cyst seen. 6. Significant distended bladder, with a Foley catheter in place. Would suggest clinical correlation as to whether the Foley catheter is clamped. 7. Mild soft tissue edema along the lateral abdominal wall and both flanks. 8. Minimal bibasilar opacities likely reflect atelectasis.   Electronically Signed   By: Roanna Raider M.D.   On: 02/19/2014 03:48   ASSESSMENT / PLAN:  PULMONARY A:   Acute respiratory failure Probable pulmonary edema Possible pneumonia Iatrogenic hyperventilation P:   Goal pH>7.30, SpO2>92 Continuous mechanical support VAP bundle Daily SBT Trend ABG/CXR; ABG now to confirm resolution of respiratory alkalosis   CARDIOVASCULAR A:  Acute systolic congestive heart failure, bedside echo personally performed with LVEF of around 10% ( HIV-related? Hyperthyroidism?, Ischemia?) Shock ( Cardiogenic? Septic?) Severe lactic acidosis P:  Goal MAP>60 Levophed gtt Trend troponin / lactate TTE BB/ACEI contraindicated  RENAL A:   Severe metabolic acidosis (lactic), improving Acute renal failure, improving Hypokalemia Hypomagnesemia P:   Trend BMP Mg 2 K 40 Lasix 40 q6h  GASTROINTESTINAL A:   Elevated bilirubin (shock liver? thyroid storm? congestive hepatopathy?) Cholecystitis? Colitis? GI Px Nutrition P:   NPO TF in am Protonix Trend LFT's RUQ [place]  HEMATOLOGIC A:   Coagulopathy, etiology unclear? Mild anemia Mild thrombocytopenia VTE P:  Trend CBC  Heparin APTT / INR DIC panel  INFECTIOUS A:   HIV+ (CD4 11) Cholecystitis? Colitis? Pneumonia? P:   Cultures and antibiotics as above Hold Dapsone, Stribild ID consulted (KZ, Drue Second, 6/15) CMV PCR  ENDOCRINE  A:   Hyperthyroidism Thyroid storm ( nausea, vomiting, tachycardia, unexplained jaundice, cardiac dysfunction) P:   Awaitng free T4, total T3 PTU 200 mg q4hrs Lugol 10 drops SL every 8 hrs and will start 1 hr after 1st dose of PTU Hydrocortisone 100 mg IV q8 hrs We  will hold beta blockers given hypotension and pressor requirements  NEUROLOGIC A:   Acute encephalopathy P:   Fentanyl gtt Versed PRN Goal RASS  -1 Daily WUA  I have personally obtained history, examined patient, evaluated and interpreted laboratory and imaging results, reviewed medical records, formulated assessment / plan and placed orders.  CRITICAL CARE:  The patient is critically ill with multiple organ systems failure and requires high complexity decision making for assessment and support, frequent evaluation and titration of therapies, application of advanced monitoring technologies and extensive interpretation of multiple databases. Critical Care Time devoted to patient care services described in this note is 45 minutes.   Lonia Farber, MD Pulmonary and Critical Care Medicine [facility] Pager: [phone]  02/19/2014, 11:07 AM

## 2014-02-20 ENCOUNTER — Inpatient Hospital Stay (HOSPITAL_COMMUNITY): Payer: Medicaid Other

## 2014-02-20 DIAGNOSIS — I428 Other cardiomyopathies: Secondary | ICD-10-CM

## 2014-02-20 DIAGNOSIS — R570 Cardiogenic shock: Secondary | ICD-10-CM

## 2014-02-20 DIAGNOSIS — I429 Cardiomyopathy, unspecified: Secondary | ICD-10-CM

## 2014-02-20 DIAGNOSIS — I469 Cardiac arrest, cause unspecified: Secondary | ICD-10-CM

## 2014-02-20 DIAGNOSIS — B20 Human immunodeficiency virus [HIV] disease: Secondary | ICD-10-CM

## 2014-02-20 LAB — BLOOD GAS, ARTERIAL
Acid-Base Excess: 3.2 mmol/L — ABNORMAL HIGH (ref 0.0–2.0)
Bicarbonate: 27.9 mEq/L — ABNORMAL HIGH (ref 20.0–24.0)
FIO2: 0.3 %
O2 SAT: 87.1 %
PATIENT TEMPERATURE: 98.6
PEEP/CPAP: 5 cmH2O
PH ART: 7.383 (ref 7.350–7.450)
RATE: 20 resp/min
TCO2: 29.3 mmol/L (ref 0–100)
VT: 400 mL
pCO2 arterial: 47.8 mmHg — ABNORMAL HIGH (ref 35.0–45.0)
pO2, Arterial: 60.2 mmHg — ABNORMAL LOW (ref 80.0–100.0)

## 2014-02-20 LAB — URINE CULTURE: Colony Count: 70000

## 2014-02-20 LAB — BASIC METABOLIC PANEL
BUN: 29 mg/dL — AB (ref 6–23)
BUN: 34 mg/dL — ABNORMAL HIGH (ref 6–23)
CALCIUM: 7.3 mg/dL — AB (ref 8.4–10.5)
CHLORIDE: 98 meq/L (ref 96–112)
CHLORIDE: 100 meq/L (ref 96–112)
CO2: 26 mEq/L (ref 19–32)
CO2: 23 meq/L (ref 19–32)
CREATININE: 1.95 mg/dL — AB (ref 0.50–1.10)
Calcium: 8.3 mg/dL — ABNORMAL LOW (ref 8.4–10.5)
Creatinine, Ser: 1.67 mg/dL — ABNORMAL HIGH (ref 0.50–1.10)
GFR calc Af Amer: 36 mL/min — ABNORMAL LOW (ref >90)
GFR calc non Af Amer: 37 mL/min — ABNORMAL LOW (ref >90)
GFR, EST AFRICAN AMERICAN: 43 mL/min — AB (ref >90)
GFR, EST NON AFRICAN AMERICAN: 31 mL/min — AB (ref >90)
Glucose, Bld: 131 mg/dL — ABNORMAL HIGH (ref 70–99)
Glucose, Bld: 114 mg/dL — ABNORMAL HIGH (ref 70–99)
POTASSIUM: 4.1 meq/L (ref 3.7–5.3)
Potassium: 3.5 mEq/L — ABNORMAL LOW (ref 3.7–5.3)
Sodium: 140 mEq/L (ref 137–147)
Sodium: 139 mEq/L (ref 137–147)

## 2014-02-20 LAB — GLUCOSE, CAPILLARY
GLUCOSE-CAPILLARY: 109 mg/dL — AB (ref 70–99)
GLUCOSE-CAPILLARY: 107 mg/dL — AB (ref 70–99)
Glucose-Capillary: 110 mg/dL — ABNORMAL HIGH (ref 70–99)
Glucose-Capillary: 120 mg/dL — ABNORMAL HIGH (ref 70–99)
Glucose-Capillary: 129 mg/dL — ABNORMAL HIGH (ref 70–99)
Glucose-Capillary: 142 mg/dL — ABNORMAL HIGH (ref 70–99)
Glucose-Capillary: 97 mg/dL (ref 70–99)

## 2014-02-20 LAB — MAGNESIUM: MAGNESIUM: 1.9 mg/dL (ref 1.5–2.5)

## 2014-02-20 LAB — CARBOXYHEMOGLOBIN
CARBOXYHEMOGLOBIN: 2.1 % — AB (ref 0.5–1.5)
METHEMOGLOBIN: 1.4 % (ref 0.0–1.5)
O2 Saturation: 82.4 %
Total hemoglobin: 11.6 g/dL — ABNORMAL LOW (ref 12.0–16.0)

## 2014-02-20 LAB — CBC
HEMATOCRIT: 33.5 % — AB (ref 36.0–46.0)
Hemoglobin: 11.4 g/dL — ABNORMAL LOW (ref 12.0–15.0)
MCH: 28.6 pg (ref 26.0–34.0)
MCHC: 34 g/dL (ref 30.0–36.0)
MCV: 84.2 fL (ref 78.0–100.0)
PLATELETS: 65 10*3/uL — AB (ref 150–400)
RBC: 3.98 MIL/uL (ref 3.87–5.11)
RDW: 20.2 % — ABNORMAL HIGH (ref 11.5–15.5)
WBC: 24.6 10*3/uL — AB (ref 4.0–10.5)

## 2014-02-20 LAB — LACTIC ACID, PLASMA: Lactic Acid, Venous: 3.8 mmol/L — ABNORMAL HIGH (ref 0.5–2.2)

## 2014-02-20 LAB — PHOSPHORUS: Phosphorus: 3.2 mg/dL (ref 2.3–4.6)

## 2014-02-20 LAB — HEPARIN LEVEL (UNFRACTIONATED)
HEPARIN UNFRACTIONATED: 0.22 [IU]/mL — AB (ref 0.30–0.70)
HEPARIN UNFRACTIONATED: 0.23 [IU]/mL — AB (ref 0.30–0.70)

## 2014-02-20 MED ORDER — HEPARIN (PORCINE) IN NACL 100-0.45 UNIT/ML-% IJ SOLN
700.0000 [IU]/h | INTRAMUSCULAR | Status: DC
  Administered 2014-02-20: 650 [IU]/h via INTRAVENOUS
  Filled 2014-02-20 (×3): qty 250

## 2014-02-20 MED ORDER — SODIUM CHLORIDE 0.9 % IV SOLN
0.0300 [IU]/min | INTRAVENOUS | Status: DC
  Administered 2014-02-20 – 2014-02-22 (×3): 0.03 [IU]/min via INTRAVENOUS
  Filled 2014-02-20 (×3): qty 2.5

## 2014-02-20 MED ORDER — PRO-STAT SUGAR FREE PO LIQD
60.0000 mL | Freq: Two times a day (BID) | ORAL | Status: DC
  Administered 2014-02-20 – 2014-02-22 (×4): 60 mL
  Filled 2014-02-20 (×5): qty 60

## 2014-02-20 MED ORDER — SODIUM CHLORIDE 0.9 % IV BOLUS (SEPSIS)
1000.0000 mL | Freq: Once | INTRAVENOUS | Status: AC
  Administered 2014-02-20: 1000 mL via INTRAVENOUS

## 2014-02-20 MED ORDER — POTASSIUM CHLORIDE 10 MEQ/50ML IV SOLN
10.0000 meq | INTRAVENOUS | Status: AC
  Administered 2014-02-20 (×4): 10 meq via INTRAVENOUS
  Filled 2014-02-20 (×4): qty 50

## 2014-02-20 MED ORDER — HYDROCORTISONE NA SUCCINATE PF 100 MG IJ SOLR
50.0000 mg | Freq: Four times a day (QID) | INTRAMUSCULAR | Status: DC
  Administered 2014-02-20 – 2014-02-22 (×8): 50 mg via INTRAVENOUS
  Filled 2014-02-20 (×12): qty 1

## 2014-02-20 MED ORDER — VITAL AF 1.2 CAL PO LIQD
1000.0000 mL | ORAL | Status: DC
  Administered 2014-02-20 – 2014-02-22 (×2): 1000 mL
  Filled 2014-02-20 (×3): qty 1000

## 2014-02-20 MED ORDER — ALBUMIN HUMAN 5 % IV SOLN
25.0000 g | Freq: Once | INTRAVENOUS | Status: AC
  Administered 2014-02-20: 25 g via INTRAVENOUS
  Filled 2014-02-20: qty 500

## 2014-02-20 MED ORDER — VANCOMYCIN HCL IN DEXTROSE 750-5 MG/150ML-% IV SOLN
750.0000 mg | INTRAVENOUS | Status: DC
  Administered 2014-02-20: 750 mg via INTRAVENOUS
  Filled 2014-02-20 (×2): qty 150

## 2014-02-20 MED ORDER — FENTANYL CITRATE 0.05 MG/ML IJ SOLN
25.0000 ug | INTRAMUSCULAR | Status: DC | PRN
  Administered 2014-02-22: 100 ug via INTRAVENOUS
  Filled 2014-02-20: qty 2

## 2014-02-20 MED FILL — Medication: Qty: 1 | Status: AC

## 2014-02-20 NOTE — Progress Notes (Signed)
eLink Physician-Brief Progress Note Patient Name: Michelle Flores DOB: 07/11/1971 MRN: [id number]  Date of Service  02/20/2014   HPI/Events of Note   Remains on high dose pressors & IABP 1:1 CVp 4 Alb 2.3  eICU Interventions  Albumin 25 g x 1 -appears hypovolemic   Intervention Category Major Interventions: Shock - evaluation and management  ALVA,RAKESH V. 02/20/2014, 4:23 PM

## 2014-02-20 NOTE — Progress Notes (Signed)
ANTICOAGULATION CONSULT NOTE - Initial Consult  Pharmacy Consult for heparin Indication: IABP    Allergies  Allergen Reactions  . Diphenhydramine Hcl Other (See Comments)    Per family, reaction unknown   . Latex Other (See Comments)    Per family, reaction unknown   . Sulfonamide Derivatives Other (See Comments)    Per family, reaction unknown     Patient Measurements: Height: 5\' 5"  (165.1 cm) Weight: 118 lb 13.3 oz (53.9 kg) IBW/kg (Calculated) : 57 Heparin Dosing Weight: 54kg  Vital Signs: Temp: 97.8 F (36.6 C) (06/16 2100) Temp src: Axillary (06/16 2100) BP: 103/33 mmHg (06/16 1158) Pulse Rate: 128 (06/16 2039)  Labs:  Recent Labs  03/04/2014 1943  02/21/2014 1950 02/19/14 0008 02/19/14 0420 02/19/14 0743  02/19/14 1725 02/20/14 0425 02/20/14 1551 02/20/14 1730 02/20/14 2210  HGB  --   < > 11.3*  --  12.4  --   --  10.1* 11.4*  --   --   --   HCT  --   < > 33.9*  --  36.1  --   --  30.3* 33.5*  --   --   --   PLT  --   < > 121*  --  PLATELET CLUMPS NOTED ON SMEAR, COUNT APPEARS DECREASED  --   --  79* 65*  --   --   --   APTT  --   --  32  --   --   --   --   --   --   --   --   --   LABPROT  --   --  24.7*  --   --   --   --   --   --   --   --   --   INR  --   --  2.32*  --   --   --   --   --   --   --   --   --   HEPARINUNFRC  --   --   --   --   --   --   --   --   --  0.22*  --  0.23*  CREATININE  --   < > 1.36*  --  1.04  --   < > 1.31* 1.67*  --  1.95*  --   TROPONINI <0.30  --   --  <0.30  --  <0.30  --   --   --   --   --   --   < > = values in this interval not displayed.  Estimated Creatinine Clearance: 32.3 ml/min (by C-G formula based on Cr of 1.95).   Medical History: No past medical history on file.  Assessment: Second hour heparin level is 0.23on 650 units/hr IV heparin, level remains therapeutic/ within lower goal of 0.2-0.5 units/ml for this 43 year old female who is s/p Emergent IABP placed at bedside on 02/19/14 after she presented to  MICU on 6/14 with respiratory failure and thyroid storm.  No bleeding noted.   Goal of Therapy:  Heparin level 0.2-0.5 units/ml Monitor platelets by anticoagulation protocol: Yes   Plan:  Continue IV heparin drip at 650 units/hr.  Monitor daily heparin level and CBC.    Noah Delaine, RPh Clinical Pharmacist Pager: [phone] 02/20/2014 10:46 PM

## 2014-02-20 NOTE — Progress Notes (Signed)
Wasted Fentanyl IV 200 CCs and Versed IV 8 CCs in sink with Mae Aninon, R.N.

## 2014-02-20 NOTE — Progress Notes (Signed)
ANTICOAGULATION CONSULT NOTE - Initial Consult  Pharmacy Consult for heparin Indication: IABP    Allergies  Allergen Reactions  . Diphenhydramine Hcl Other (See Comments)    Per family, reaction unknown   . Latex Other (See Comments)    Per family, reaction unknown   . Sulfonamide Derivatives Other (See Comments)    Per family, reaction unknown     Patient Measurements: Height: 5\' 5"  (165.1 cm) Weight: 118 lb 13.3 oz (53.9 kg) IBW/kg (Calculated) : 57 Heparin Dosing Weight: 54kg  Vital Signs: Temp: 98 F (36.7 C) (06/16 1600) Temp src: Oral (06/16 1600) BP: 103/33 mmHg (06/16 1158) Pulse Rate: 122 (06/16 1158)  Labs:  Recent Labs  02/11/2014 1943  02/26/2014 1950 02/19/14 0008 02/19/14 0420 02/19/14 0743 02/19/14 1000 02/19/14 1725 02/20/14 0425 02/20/14 1551  HGB  --   < > 11.3*  --  12.4  --   --  10.1* 11.4*  --   HCT  --   < > 33.9*  --  36.1  --   --  30.3* 33.5*  --   PLT  --   < > 121*  --  PLATELET CLUMPS NOTED ON SMEAR, COUNT APPEARS DECREASED  --   --  79* 65*  --   APTT  --   --  32  --   --   --   --   --   --   --   LABPROT  --   --  24.7*  --   --   --   --   --   --   --   INR  --   --  2.32*  --   --   --   --   --   --   --   HEPARINUNFRC  --   --   --   --   --   --   --   --   --  0.22*  CREATININE  --   < > 1.36*  --  1.04  --  1.03 1.31* 1.67*  --   TROPONINI <0.30  --   --  <0.30  --  <0.30  --   --   --   --   < > = values in this interval not displayed.  Estimated Creatinine Clearance: 37.7 ml/min (by C-G formula based on Cr of 1.67).   Medical History: Michelle past medical history on file.  Assessment: Initial 6 hour heparin level is 0.22 on 650 units/hr IV heparin. Currently level is within the goal of 0.2-0.5 units/ml for this 43 year old female who is s/p Emergent IABP placed at bedside on 02/19/14 after she presented to MICU on 6/14 with respiratory failure and thyroid storm.   Hemoglobin has been stable but plt count has continued to  decline with latest result of 65. Patient with multi-system organ failure, HIT very unlikely as patient has only been receiving sq heparin since admission on 6/14. Michelle bleeding noted.   Goal of Therapy:  Heparin level 0.2-0.5 units/ml Monitor platelets by anticoagulation protocol: Yes   Plan:  Continue IV heparin drip at 650 units/hr.  Recheck heparin level in ~ 6 hours to confirm remains therapeutic then daily heparin level and CBC.    Noah Delaine, RPh Clinical Pharmacist Pager: [phone]  02/20/2014 5:34 PM

## 2014-02-20 NOTE — Progress Notes (Addendum)
PULMONARY / CRITICAL CARE MEDICINE  Name: Michelle Flores MRN: [id number] DOB: 02/17/1971    ADMISSION DATE:  02/08/2014 CONSULTATION DATE:  03/02/2014  REFERRING MD :  EDP PRIMARY SERVICE:  PCCM  CHIEF COMPLAINT:  Acute respiratory failure  BRIEF PATIENT DESCRIPTION: 43 yo HIV+ (followed by Dr. Orvan Falconerampbell) presented [facility] with nausea, vomiting and abdominal pain. Found to have elevated bilirubin. Presented to [facility] ED 6/14 with dyspnea, hypoxia, bilateral pulmonary infiltrates, severe acidosis, hypotension. Intubated. Hypotensive post-intubation requiring vasopressors.  SIGNIFICANT EVENTS / STUDIES:  6/14  Limited echocardiogram:  severe global myocardial hypokinesis.  6/14 TSH < .008, free T4 1.82 (minimally elevated), free T3 91 (normal) 6/15  CTAP: Gallbladder wall edematous, colitis 6/15  Echocardiogram:  LVEF 15% 6/15 PEA, then VF arrest. Prolonged ACLS (epi X 4, Ca2+, HCO3). Persistent shock requiring max dose vasopressors. Amiodarone and milrinone initiated. Emergent IABP placed. Transferred to CCU  LINES / TUBES: ETT 6/14 >>  L Moran CVL 6/14 >>  R femoral A-line 6/15 >>  IABP, L femoral artery 6/15 >>   CULTURES: Urine 6/14 >>  Resp 6/14 >> Blood 6/14 >>   ANTIBIOTICS: Flagyl 6/14 >> 6/15 Zosyn 6/14 >>  Vancomycin 6/14 >>    INTERVAL HISTORY:   Minimally responsive on low dose fentanyl infusion  VITAL SIGNS: Temp:  [97.6 F (36.4 C)-98.1 F (36.7 C)] 97.7 F (36.5 C) (06/16 1100) Pulse Rate:  [31-139] 122 (06/16 1158) Resp:  [19-33] 20 (06/16 1500) BP: (85-136)/(33-114) 103/33 mmHg (06/16 1158) SpO2:  [47 %-100 %] 100 % (06/16 1500) Arterial Line BP: (91-114)/(29-62) 91/30 mmHg (06/16 1500) FiO2 (%):  [30 %-100 %] 30 % (06/16 1500) Weight:  [53.9 kg (118 lb 13.3 oz)] 53.9 kg (118 lb 13.3 oz) (06/16 0459)  HEMODYNAMICS: CVP:  [2 mmHg-5 mmHg] 5 mmHg  VENTILATOR SETTINGS: Vent Mode:  [-] PRVC FiO2 (%):  [30 %-100 %] 30 % Set Rate:  [20 bmp] 20  bmp Vt Set:  [400 mL] 400 mL PEEP:  [5 cmH20] 5 cmH20 Plateau Pressure:  [13 cmH20-20 cmH20] 16 cmH20  INTAKE / OUTPUT: Intake/Output     06/15 0701 - 06/16 0700 06/16 0701 - 06/17 0700   I.V. (mL/kg) 2422 (44.9) 620.8 (11.5)   NG/GT 180 80   IV Piggyback 500 1387.5   Total Intake(mL/kg) 3102 (57.6) 2088.3 (38.7)   Urine (mL/kg/hr) 1265 (1) 150 (0.3)   Total Output 1265 150   Net +1837 +1938.3          PHYSICAL EXAMINATION: General:  Minimally responsive Neuro:  No spont movement, minimal withdrawal HEENT:  PERRL, EOMI, NCAT Cardiovascular:  Tachy, freq extrasystoles, HS obscured by IABP Lungs: clear anteriorly Abdomen:  Soft, nontender, bowel sounds diminished Ext: no edema, cool  LABS: I have reviewed all of today's lab results. Relevant abnormalities are discussed in the A/P section  CXR: minimal IS prominence   ASSESSMENT / PLAN:  PULMONARY A:   Acute respiratory failure P:   Cont full vent support - settings reviewed and/or adjusted Cont vent bundle Daily SBT if/when meets criteria  CARDIOVASCULAR A:  Severe cardiomyopathy, unclear etiology Cardiogenic shock with severe lactic acidosis NSVT 6/14   S/P PEA, then VF arrest 6/15 P:  Cards assisting Wean pressors for MAP > 65 mmHg Cont amiodarone Cont milrinone Cont empiric stress dose steroids Cont IABP  RENAL A:   Severe lactic acidosis, improving Acute renal failure Hypokalemia, resolved Hypomagnesemia, resolved P:   Monitor BMET intermittently Monitor I/Os Correct  electrolytes as indicated Very poor candidate for CRRT presently  GASTROINTESTINAL A:   Elevated bilirubin Possible cholecystitis Possible colitis P:   SUP: IV PPI Trickle TFs Monitor LFTs RUQ [place] ordered  HEMATOLOGIC A:   Coagulopathy, etiology unclear? Mild anemia Mild thrombocytopenia VTE P:  Trend CBC  Heparin APTT / INR DIC panel  INFECTIOUS A:   HIV+ (CD4 11) Possible cholecystitis Possible  colitis Doubt pneumonia P:   Holding ART Micro and abx as above  ENDOCRINE  A:   Low TSH but normal T3 and minimally elevated T4 Possible thyroid storm P:   Awaitng free T4, total T3 DC PTU and Lugol sol Monitor TFTs frequently Consider endocrinology consult CBGs q 6 hrs SSI if needed for glu > 180   NEUROLOGIC A:   Acute post anoxic encephalopathy P:   Avoid cont sedative/analgesics Further eval to be dictated by course of illness Daily WUA  I have personally obtained history, examined patient, evaluated and interpreted laboratory and imaging results, reviewed medical records, formulated assessment / plan and placed orders.  CRITICAL CARE:  The patient is critically ill with multiple organ systems failure and requires high complexity decision making for assessment and support, frequent evaluation and titration of therapies, application of advanced monitoring technologies and extensive interpretation of multiple databases. Critical Care Time devoted to patient care services described in this note is 45 minutes.   Billy Fischer, MD ; [facility] [phone].  After 5:30 PM or weekends, call [phone]   02/20/2014, 3:09 PM

## 2014-02-20 NOTE — Progress Notes (Addendum)
Advanced Heart Failure Rounding Note   Subjective:    Ms Shela NevinCoble is a 43 year old with a history of HIV (2011)/AIDS (CD4 = 28), hyperthyroidism who presented to [facility] with N/V and abdominal pain. She had elevated bilirubin and was severely acidotic (lactic acid 16.9) requiring intubation and transfer to [facility]. After intubation she was hypotension and required vasopressor. Bedside TEE performed EF 10% severe global hypokinesis. Treatment initiated for hyperthyroidism with PTU, steroids and iodine.   Had VT, PEA arrest. IABP placed on 6/14.   Remains intubated/sedated. Non-responsive even to noxious stimuli. Requiring Levo 42 mcg , Vasopressin 0.03 units/min, amiodarone, and Milrinone 0.375. Weight up 7 pounds. CVP 5. Co-ox 82%    Creatinine 1.3>1.6 Lactic Acid 17.5-> 3.8  Objective:   Weight Range:  Vital Signs:   Temp:  [97.6 F (36.4 C)-98.3 F (36.8 C)] 97.6 F (36.4 C) (06/16 0500) Pulse Rate:  [31-139] 123 (06/16 0427) Resp:  [11-33] 20 (06/16 0700) BP: (46-136)/(26-114) 108/41 mmHg (06/16 0427) SpO2:  [47 %-100 %] 97 % (06/16 0700) Arterial Line BP: (15-301)/(11-296) 101/39 mmHg (06/16 0700) FiO2 (%):  [30 %-100 %] 30 % (06/16 0700) Weight:  [118 lb 13.3 oz (53.9 kg)] 118 lb 13.3 oz (53.9 kg) (06/16 0459) Last BM Date:  (PTA)  Weight change: Filed Weights   03/03/2014 2000 02/19/14 0400 02/20/14 0459  Weight: 112 lb 10.5 oz (51.1 kg) 111 lb 5.3 oz (50.5 kg) 118 lb 13.3 oz (53.9 kg)    Intake/Output:   Intake/Output Summary (Last 24 hours) at 02/20/14 0723 Last data filed at 02/20/14 0700  Gross per 24 hour  Intake 3101.98 ml  Output   1265 ml  Net 1836.98 ml     Physical Exam: General:  Intubated. Sedated.  HEENT: normal x for mild exopthalmossis Neck: supple. JVP 5 . Carotids 2+ bilat; no bruits. No lymphadenopathy or thryomegaly appreciated. Cor: PMI nondisplaced. Tachy regular. + summation gallop Lungs: clear Abdomen: soft, nontender, nondistended.  No hepatosplenomegaly. No bruits or masses. Good bowel sounds. Extremities: no cyanosis, clubbing, rash, edema warm R groin IABP Neuro: alert & orientedx3, cranial nerves grossly intact. moves all 4 extremities w/o difficulty. Affect pleasant  Telemetry: ST 120s  Labs: Basic Metabolic Panel:  Recent Labs Lab 02/15/2014 1950 02/19/14 0420 02/19/14 1000 02/19/14 1113 02/19/14 1725 02/19/14 2313 02/20/14 0425  NA 140 136* 139  --  145  --  140  K 4.4 3.1* 3.5*  --  4.4  --  3.5*  CL 95* 93* 95*  --  101  --  98  CO2 13* 24 30  --  13*  --  26  GLUCOSE 98 146* 133*  --  189*  --  131*  BUN 21 21 22   --  22  --  29*  CREATININE 1.36* 1.04 1.03  --  1.31*  --  1.67*  CALCIUM 8.7 8.2* 8.0*  --  9.4  --  8.3*  MG 1.9 1.5  --  2.2  --  1.9  --   PHOS 5.8*  --   --  3.8  --  3.2  --     Liver Function Tests:  Recent Labs Lab 02/19/2014 1950  AST 72*  ALT 23  ALKPHOS 134*  BILITOT 5.2*  PROT 7.3  ALBUMIN 2.3*   No results found for this basename: LIPASE, AMYLASE,  in the last 168 hours No results found for this basename: AMMONIA,  in the last 168 hours  CBC:  Recent Labs Lab March 04, 2014 1950 02/19/14 0420 02/19/14 1725 02/20/14 0425  WBC 12.8* 8.6 17.1* 24.6*  NEUTROABS 11.0*  --   --   --   HGB 11.3* 12.4 10.1* 11.4*  HCT 33.9* 36.1 30.3* 33.5*  MCV 87.8 83.8 88.6 84.2  PLT 121* PLATELET CLUMPS NOTED ON SMEAR, COUNT APPEARS DECREASED 79* 65*    Cardiac Enzymes:  Recent Labs Lab March 04, 2014 1943 02/19/14 0008 02/19/14 0743  TROPONINI <0.30 <0.30 <0.30    BNP: BNP (last 3 results)  Recent Labs  2014/03/04 1950  PROBNP 29562.1*     Other results:  EKG:   Imaging: Dg Chest Port 1 View  02/19/2014   CLINICAL DATA:  Hypoxia  EXAM: PORTABLE CHEST - 1 VIEW  COMPARISON:  03/04/14  FINDINGS: The endotracheal tube tip is 3.5 cm above the carina. Central catheter tip is in the superior vena cava. Nasogastric tube tip and side port are in the stomach. No  pneumothorax.  There is no edema or consolidation. Heart is upper normal in size with normal pulmonary vascularity. No adenopathy. No bone lesions.  IMPRESSION: Tube and catheter positions as described without pneumothorax. No edema or consolidation.   Electronically Signed   By: Bretta Bang M.D.   On: 02/19/2014 12:35   Dg Chest Port 1 View  03/04/2014   CLINICAL DATA:  Central line placement.  EXAM: PORTABLE CHEST - 1 VIEW  COMPARISON:  Several exams earlier today.  FINDINGS: Cardiomegaly remains present. Enteric tube and endotracheal tube unchanged. Interval placement of left subclavian central line with the tip in the mid SVC. No pneumothorax.  IMPRESSION: Uncomplicated left subclavian central line placement. Unchanged cardiomegaly and other support apparatus.   Electronically Signed   By: Andreas Newport M.D.   On: 03-04-2014 22:44   Dg Chest Port 1 View  03/04/14   CLINICAL DATA:  Endotracheal tube placement. Postprocedural radiograph.  EXAM: PORTABLE CHEST - 1 VIEW  COMPARISON:  04-Mar-2014 at 1805 hr.  FINDINGS: The endotracheal tube is unchanged. Interval introduction of an enteric tube with the tip not visualized. Redundant loop in the gastric fundus. Cardiomegaly remains present. Monitoring leads project over the chest. No airspace disease. No effusion.  IMPRESSION: Interval placement of enteric tube. Endotracheal tube unchanged. Unchanged cardiomegaly.   Electronically Signed   By: Andreas Newport M.D.   On: 2014/03/04 20:21   Ct Angio Abd/pel W/ And/or W/o  02/19/2014   CLINICAL DATA:  Elevated lactic acid. Shock. Assess for bowel ischemia.  EXAM: CTA ABDOMEN AND PELVIS WITHOUT AND WITH CONTRAST  TECHNIQUE: Multidetector CT imaging of the abdomen and pelvis was performed using the standard protocol during bolus administration of intravenous contrast. Multiplanar reconstructed images and MIPs were obtained and reviewed to evaluate the vascular anatomy.  CONTRAST:  OMNIPAQUE IOHEXOL  350 MG/ML SOLN  COMPARISON:  CT of the abdomen and pelvis performed 02/17/2014, and abdominal ultrasound performed 03-04-14  FINDINGS: No calcific atherosclerotic disease is seen. There is no evidence of luminal narrowing. The abdominal aorta is unremarkable in appearance. The celiac trunk, superior mesenteric artery, bilateral renal arteries and inferior mesenteric artery are unremarkable in appearance. There is no evidence of occlusion. Visualized mesenteric vessels remain fully patent.  Minimal bibasilar opacities likely reflect atelectasis.  The liver and spleen are unremarkable in appearance. There is persistent diffuse gallbladder wall edema, with vicarious contrast excretion noted in the gallbladder. The pancreas and adrenal glands are unremarkable.  Scattered right renal cysts are seen, measuring up  to 1.5 cm in size. There is no evidence of hydronephrosis. No renal or ureteral stones are seen. No perinephric stranding is appreciated.  A small amount of free fluid within the pelvis is nonspecific but may be physiologic in nature. The small bowel is unremarkable in appearance. The stomach is within normal limits. The patient's enteric tube is seen ending at the antrum of the stomach. No acute vascular abnormalities are seen.  There suggestion of mild wall thickening along the ascending colon, which could reflect a mild infectious or inflammatory process. The remainder of the colon is unremarkable in appearance. The appendix is not definitely characterized.  The bladder is significantly distended, with a Foley catheter in place. Would suggest clinical correlation as to whether the Foley catheter is clamped. The uterus is unremarkable in appearance. The ovaries are relatively symmetric. No suspicious adnexal masses are seen. No inguinal lymphadenopathy is seen.  Mild soft tissue edema is noted along the lateral abdominal wall and both flanks.  No acute osseous abnormalities are identified.  Review of the MIP  images confirms the above findings.  IMPRESSION: 1. No evidence for bowel ischemia. Visualized vasculature is fully patent. No calcific atherosclerotic disease seen. 2. Persistent diffuse gallbladder wall edema, as seen on recent prior studies. Would correlate clinically to exclude cholecystitis. 3. Mild new wall thickening along the ascending colon, which could reflect a mild acute infectious or inflammatory colitis. 4. Nonspecific small amount of free fluid noted within the pelvis; this could reflect the suggested colitis. 5. Small right renal cyst seen. 6. Significant distended bladder, with a Foley catheter in place. Would suggest clinical correlation as to whether the Foley catheter is clamped. 7. Mild soft tissue edema along the lateral abdominal wall and both flanks. 8. Minimal bibasilar opacities likely reflect atelectasis.   Electronically Signed   By: Roanna Raider M.D.   On: 02/19/2014 03:48      Medications:     Scheduled Medications: . antiseptic oral rinse  15 mL Mouth Rinse QID  . chlorhexidine  15 mL Mouth Rinse BID  . EPINEPHrine  1 mg Intravenous Once  . heparin subcutaneous  5,000 Units Subcutaneous 3 times per day  . hydrocortisone sod succinate (SOLU-CORTEF) inj  100 mg Intravenous Q8H  . Iodine Strong (Lugols)  0.2 mL Oral TID  . pantoprazole (PROTONIX) IV  40 mg Intravenous Q24H  . piperacillin-tazobactam (ZOSYN)  IV  3.375 g Intravenous 3 times per day  . propylthiouracil  200 mg Per NG tube Q4H  . sodium bicarbonate  50 mEq Intravenous Once  . vancomycin  500 mg Intravenous Q12H     Infusions: . sodium chloride 20 mL/hr at 02/19/14 0700  . amiodarone 30 mg/hr (02/20/14 0121)  . feeding supplement (VITAL AF 1.2 CAL)    . fentaNYL infusion INTRAVENOUS 50 mcg/hr (02/20/14 0514)  . midazolam (VERSED) infusion Stopped (02/19/14 1336)  . milrinone 0.375 mcg/kg/min (02/20/14 0316)  . norepinephrine (LEVOPHED) Adult infusion 42 mcg/min (02/20/14 0514)  . phenylephrine  (NEO-SYNEPHRINE) Adult infusion    . vasopressin (PITRESSIN) infusion - *FOR SHOCK* 0.03 Units/min (02/19/14 1730)     PRN Medications:  midazolam   Assessment:  1. Acute Respiratory Failure- Intubated  2. Cardiogenic Shock  3. Cardiac arrest - S/P PEA  4. V Tach  5. HIV (CD4 28)  6. Hyperthyroidism - thyroid storm 7. Acute Systolic Heart Failure EF 10%  8. Thrombocytopenia  Plan/Discussion:    Length of Stay: 2 CLEGG,AMY 02/20/2014, 7:23 AM  Advanced  Heart Failure Team Pager [phone] (M-F; 7a - 4p)  Please contact [facility] for night-coverage after hours (4p -7a ) and weekends on [email or web address]  Patient seen and examined with Tonye Becket, NP. We discussed all aspects of the encounter. I agree with the assessment and plan as stated above.   Patient remains critically ill. Cardiac output now stabilized with high-dose pressors and IABP support. Will try to wean levophed as tolerated to keep augmented MAPs 65-70. Volume status is fine - would not add lasix at this point.Goal of IABP therapy will be to try and stabilize cardiac function while thyrotoxicosis (and HIV) are treated. Prognosis remains extremely poor.  IABP position satisfactory on CXR. Neuro status is concerning - but unable to assess until sedation wears off. Start heparin for IABP.   Unable to add b-blocker or ACE due to shock.   The patient is critically ill with multiple organ systems failure and requires high complexity decision making for assessment and support, frequent evaluation and titration of therapies, application of advanced monitoring technologies and extensive interpretation of multiple databases.   Critical Care Time personally devoted to patient care services described in this note is 35 Minutes.  Kei Mcelhiney,MD 8:16 AM

## 2014-02-20 NOTE — Progress Notes (Addendum)
ANTICOAGULATION CONSULT NOTE - Initial Consult  Pharmacy Consult for heparin Indication: IABP  Pharmacy Consult: Vancomycin/Zosyn Indication: pneumonia/cholecystitis  Allergies  Allergen Reactions  . Diphenhydramine Hcl Other (See Comments)    Per family, reaction unknown   . Latex Other (See Comments)    Per family, reaction unknown   . Sulfonamide Derivatives Other (See Comments)    Per family, reaction unknown     Patient Measurements: Height: 5\' 5"  (165.1 cm) Weight: 118 lb 13.3 oz (53.9 kg) IBW/kg (Calculated) : 57 Heparin Dosing Weight: 54kg  Vital Signs: Temp: 98.1 F (36.7 C) (06/16 0755) Temp src: Oral (06/16 0755) BP: 102/42 mmHg (06/16 0800) Pulse Rate: 130 (06/16 0800)  Labs:  Recent Labs  2014/03/20 1943  2014/03/20 1950 02/19/14 0008 02/19/14 0420 02/19/14 0743 02/19/14 1000 02/19/14 1725 02/20/14 0425  HGB  --   < > 11.3*  --  12.4  --   --  10.1* 11.4*  HCT  --   < > 33.9*  --  36.1  --   --  30.3* 33.5*  PLT  --   < > 121*  --  PLATELET CLUMPS NOTED ON SMEAR, COUNT APPEARS DECREASED  --   --  79* 65*  APTT  --   --  32  --   --   --   --   --   --   LABPROT  --   --  24.7*  --   --   --   --   --   --   INR  --   --  2.32*  --   --   --   --   --   --   CREATININE  --   < > 1.36*  --  1.04  --  1.03 1.31* 1.67*  TROPONINI <0.30  --   --  <0.30  --  <0.30  --   --   --   < > = values in this interval not displayed.  Estimated Creatinine Clearance: 37.7 ml/min (by C-G formula based on Cr of 1.67).   Medical History: No past medical history on file.  Assessment: 43 year old female presenting to MICU on 6/14 with respiratory failure and thyroid storm. Emergent IABP placed at bedside on 6/15. Patient has been receiving sq heparin with last dose this morning, will now change to IV heparin for anticoagulation with pump in place.   Hemoglobin has been stable but plt count continues to decline with latest result of 65. Patient with multi-system organ  failure, HIT very unlikely as patient has only been receiving sq heparin since admission on 6/14. No bleeding noted by nursing from lines, will continue to follow closely.   Pneumonia/HCAP/cholecystitis - patient currently on day #2 of vancomycin and zosyn. No fevers noted, wbc rising at 24, lactic acid 17>>3.8  6/14 urine - ecoli - sens pending 6/14 resp -pending 6/14 bld x2 - sent  Goal of Therapy:  Heparin level 0.2-0.5 units/ml Monitor platelets by anticoagulation protocol: Yes Vancomycin trough 15-20   Plan:  Start heparin infusion at 650 units/hr Check anti-Xa level in 6 hours and daily while on heparin Continue to monitor H&H and platelets Decrease vancomycin to 750mg  q24 hours Continue zosyn 3.375g q8 hours Follow culture data and renal function  Sheppard Coil PharmD., BCPS Clinical Pharmacist Pager [phone] 02/20/2014 8:16 AM

## 2014-02-20 NOTE — Progress Notes (Addendum)
NUTRITION FOLLOW-UP  DOCUMENTATION CODES Per approved criteria  -Severe malnutrition in the context of chronic illness   Pt meets criteria for severe MALNUTRITION in the context of chronic illness as evidenced by severe depletion of muscle mass with >10% weight loss within the past 3 months.  INTERVENTION: Add 60 ml Prostat BID. With Vital AF 1.2 at 15 ml/hr, this regimen will provide: 832 kcal (64% of estimated kcal needs), 87 grams protein (100% of estimated protein needs), 292 ml free water. To better meet nutritional needs, recommend increasing Vital AF 1.2 by 10 ml/hr to goal rate of 45 ml/hr and discontinuing Prostat supplementation - this regimen will provide 1296 kcals, 81 gm protein, 876 ml free water daily. RD to continue to follow nutrition care plan.  NUTRITION DIAGNOSIS: Malnutrition related to inadequate oral intake with chronic catabolic illness as evidenced by severe depletion of muscle mass with >10% weight loss within the past 3 months. Ongoing.  Goal: Intake to meet >90% of estimated nutrition needs. Unmet.  Monitor:  TF tolerance/adequacy, weight trend, labs, vent status  ASSESSMENT: 43 yo HIV+ ( followed by Dr. Orvan Falconerampbell ) [facility] with nausea, vomiting and abdominal pain. Found to have elevated bilirubin. Today developed dyspnea, hypoxia and bilateral pulmonary infiltrates. Noted to be severely acidotic. Intubated. Hypotensive post-intubation requiring vasopressors.  Per H&P, patient has lost over 30 pounds in the last two months. Patient is at risk for refeeding syndrome given severe malnutrition. Currently ordered for Vital AF 1.2 at 15 ml/hr per Dr. Sung AmabileSimonds. Has yet to start.  Pt is s/p cardiac arrest x 3. Per MD, pt with "extremely poor" prognosis. Underwent emergent IABP on 6/15.  Patient is currently intubated on ventilator support MV: 8 L/min Temp (24hrs), Avg:97.8 F (36.6 C), Min:97.6 F (36.4 C), Max:98.1 F (36.7 C)  Potassium  low at 3.5 Phosphorus and magnesium WNL  Height: Ht Readings from Last 1 Encounters:  Dec 28, 2013 5\' 5"  (1.651 m)    Weight: Wt Readings from Last 1 Encounters:  02/20/14 118 lb 13.3 oz (53.9 kg)  Admit wt 112 lb  BMI:  Body mass index is 19.77 kg/(m^2). WNL  Estimated Nutritional Needs: Kcal: 1293 Protein: 80-90 gm Fluid: 1.5-1.7 L  Skin: intact  Diet Order: NPO   Intake/Output Summary (Last 24 hours) at 02/20/14 1329 Last data filed at 02/20/14 1300  Gross per 24 hour  Intake 4221.96 ml  Output    515 ml  Net 3706.96 ml    Last BM: PTA   Labs:   Recent Labs Lab Dec 28, 2013 1950 02/19/14 0420 02/19/14 1000 02/19/14 1113 02/19/14 1725 02/19/14 2313 02/20/14 0425  NA 140 136* 139  --  145  --  140  K 4.4 3.1* 3.5*  --  4.4  --  3.5*  CL 95* 93* 95*  --  101  --  98  CO2 13* 24 30  --  13*  --  26  BUN 21 21 22   --  22  --  29*  CREATININE 1.36* 1.04 1.03  --  1.31*  --  1.67*  CALCIUM 8.7 8.2* 8.0*  --  9.4  --  8.3*  MG 1.9 1.5  --  2.2  --  1.9  --   PHOS 5.8*  --   --  3.8  --  3.2  --   GLUCOSE 98 146* 133*  --  189*  --  131*    CBG (last 3)   Recent Labs  02/20/14  5003 02/20/14 0757 02/20/14 1154  GLUCAP 142* 97 109*    Scheduled Meds: . antiseptic oral rinse  15 mL Mouth Rinse QID  . chlorhexidine  15 mL Mouth Rinse BID  . hydrocortisone sod succinate (SOLU-CORTEF) inj  50 mg Intravenous Q6H  . pantoprazole (PROTONIX) IV  40 mg Intravenous Q24H  . piperacillin-tazobactam (ZOSYN)  IV  3.375 g Intravenous 3 times per day  . propylthiouracil  200 mg Per NG tube Q4H  . vancomycin  750 mg Intravenous Q24H    Continuous Infusions: . sodium chloride 20 mL/hr at 02/19/14 0700  . amiodarone 30 mg/hr (02/20/14 0800)  . feeding supplement (VITAL AF 1.2 CAL)    . heparin 650 Units/hr (02/20/14 0930)  . milrinone 0.375 mcg/kg/min (02/20/14 0800)  . norepinephrine (LEVOPHED) Adult infusion 38 mcg/min (02/20/14 1015)  . vasopressin (PITRESSIN)  infusion - *FOR SHOCK*      Jarold Motto MS, RD, LDN Inpatient Registered Dietitian Pager: [phone] After-hours pager: [phone]

## 2014-02-21 ENCOUNTER — Inpatient Hospital Stay (HOSPITAL_COMMUNITY): Payer: Medicaid Other

## 2014-02-21 DIAGNOSIS — I5021 Acute systolic (congestive) heart failure: Principal | ICD-10-CM

## 2014-02-21 LAB — T3, FREE: T3, Free: 1.8 pg/mL — ABNORMAL LOW (ref 2.3–4.2)

## 2014-02-21 LAB — CBC
HEMATOCRIT: 27.3 % — AB (ref 36.0–46.0)
Hemoglobin: 9.3 g/dL — ABNORMAL LOW (ref 12.0–15.0)
MCH: 28.9 pg (ref 26.0–34.0)
MCHC: 34.1 g/dL (ref 30.0–36.0)
MCV: 84.8 fL (ref 78.0–100.0)
Platelets: 47 10*3/uL — ABNORMAL LOW (ref 150–400)
RBC: 3.22 MIL/uL — AB (ref 3.87–5.11)
RDW: 21 % — AB (ref 11.5–15.5)
WBC: 23.4 10*3/uL — AB (ref 4.0–10.5)

## 2014-02-21 LAB — CYTOMEGALOVIRUS PCR, QUALITATIVE: Cytomegalovirus DNA: NOT DETECTED

## 2014-02-21 LAB — CARBOXYHEMOGLOBIN
CARBOXYHEMOGLOBIN: 2.4 % — AB (ref 0.5–1.5)
Methemoglobin: 0.8 % (ref 0.0–1.5)
O2 SAT: 86.3 %
TOTAL HEMOGLOBIN: 8.8 g/dL — AB (ref 12.0–16.0)

## 2014-02-21 LAB — VANCOMYCIN, TROUGH: Vancomycin Tr: 31.1 ug/mL (ref 10.0–20.0)

## 2014-02-21 LAB — COMPREHENSIVE METABOLIC PANEL
ALBUMIN: 1.9 g/dL — AB (ref 3.5–5.2)
ALT: 78 U/L — ABNORMAL HIGH (ref 0–35)
AST: 317 U/L — AB (ref 0–37)
Alkaline Phosphatase: 101 U/L (ref 39–117)
BUN: 40 mg/dL — ABNORMAL HIGH (ref 6–23)
CO2: 22 meq/L (ref 19–32)
Calcium: 7.9 mg/dL — ABNORMAL LOW (ref 8.4–10.5)
Chloride: 95 mEq/L — ABNORMAL LOW (ref 96–112)
Creatinine, Ser: 2.31 mg/dL — ABNORMAL HIGH (ref 0.50–1.10)
GFR calc Af Amer: 29 mL/min — ABNORMAL LOW (ref >90)
GFR calc non Af Amer: 25 mL/min — ABNORMAL LOW (ref >90)
Glucose, Bld: 129 mg/dL — ABNORMAL HIGH (ref 70–99)
POTASSIUM: 4.1 meq/L (ref 3.7–5.3)
Sodium: 136 mEq/L — ABNORMAL LOW (ref 137–147)
Total Bilirubin: 7.8 mg/dL — ABNORMAL HIGH (ref 0.3–1.2)
Total Protein: 5.2 g/dL — ABNORMAL LOW (ref 6.0–8.3)

## 2014-02-21 LAB — PROTIME-INR
INR: 2.21 — ABNORMAL HIGH (ref 0.00–1.49)
Prothrombin Time: 23.8 seconds — ABNORMAL HIGH (ref 11.6–15.2)

## 2014-02-21 LAB — GLUCOSE, CAPILLARY
GLUCOSE-CAPILLARY: 145 mg/dL — AB (ref 70–99)
Glucose-Capillary: 128 mg/dL — ABNORMAL HIGH (ref 70–99)
Glucose-Capillary: 129 mg/dL — ABNORMAL HIGH (ref 70–99)
Glucose-Capillary: 139 mg/dL — ABNORMAL HIGH (ref 70–99)

## 2014-02-21 LAB — HEPARIN LEVEL (UNFRACTIONATED)
HEPARIN UNFRACTIONATED: 0.1 [IU]/mL — AB (ref 0.30–0.70)
Heparin Unfractionated: 0.16 IU/mL — ABNORMAL LOW (ref 0.30–0.70)
Heparin Unfractionated: 0.2 IU/mL — ABNORMAL LOW (ref 0.30–0.70)

## 2014-02-21 LAB — T4, FREE: FREE T4: 1.28 ng/dL (ref 0.80–1.80)

## 2014-02-21 LAB — TSH: TSH: 0.015 u[IU]/mL — ABNORMAL LOW (ref 0.350–4.500)

## 2014-02-21 MED ORDER — PIPERACILLIN-TAZOBACTAM IN DEX 2-0.25 GM/50ML IV SOLN
2.2500 g | Freq: Four times a day (QID) | INTRAVENOUS | Status: DC
  Administered 2014-02-21 – 2014-02-22 (×4): 2.25 g via INTRAVENOUS
  Filled 2014-02-21 (×6): qty 50

## 2014-02-21 MED ORDER — PANTOPRAZOLE SODIUM 40 MG PO PACK
40.0000 mg | PACK | Freq: Every day | ORAL | Status: DC
  Administered 2014-02-21 – 2014-02-22 (×2): 40 mg
  Filled 2014-02-21 (×2): qty 20

## 2014-02-21 MED ORDER — HEPARIN (PORCINE) IN NACL 100-0.45 UNIT/ML-% IJ SOLN
1050.0000 [IU]/h | INTRAMUSCULAR | Status: DC
  Administered 2014-02-21: 900 [IU]/h via INTRAVENOUS
  Administered 2014-02-21: 750 [IU]/h via INTRAVENOUS
  Administered 2014-02-22: 1050 [IU]/h via INTRAVENOUS
  Filled 2014-02-21 (×2): qty 250

## 2014-02-21 MED ORDER — ALBUMIN HUMAN 5 % IV SOLN
25.0000 g | Freq: Four times a day (QID) | INTRAVENOUS | Status: AC
  Administered 2014-02-21 (×3): 25 g via INTRAVENOUS
  Filled 2014-02-21 (×3): qty 500

## 2014-02-21 NOTE — Progress Notes (Signed)
Advanced Heart Failure Rounding Note   Subjective:    Michelle Flores is a 43 year old with a history of HIV (2011)/AIDS (CD4 = 28), hyperthyroidism who presented to [facility] with N/V and abdominal pain. She had elevated bilirubin and was severely acidotic (lactic acid 16.9) requiring intubation and transfer to [facility]. After intubation she was hypotension and required vasopressor. Bedside TEE performed EF 10% severe global hypokinesis. Treatment initiated for hyperthyroidism with PTU, steroids and iodine.   Had VT, PEA arrest. IABP placed on 6/14.   Remains intubated/sedated. Non-responsive even to noxious stimuli. Worsening hypotension this am. Levophed increased. Essentially anuric.   Creatinine 1.6>1.9> 2.31  CO-OX 86% Lactic Acid 17.5-> 3.8 TSH 0.015  Objective:   Weight Range:  Vital Signs:   Temp:  [97.7 F (36.5 C)-98.1 F (36.7 C)] 98 F (36.7 C) (06/17 0400) Pulse Rate:  [122-133] 133 (06/17 0421) Resp:  [19-21] 21 (06/17 0600) BP: (102-103)/(33-42) 103/33 mmHg (06/16 1158) SpO2:  [96 %-100 %] 100 % (06/17 0600) Arterial Line BP: (88-110)/(30-47) 94/33 mmHg (06/17 0600) FiO2 (%):  [30 %] 30 % (06/17 0600) Weight:  [127 lb 10.3 oz (57.9 kg)] 127 lb 10.3 oz (57.9 kg) (06/17 0500) Last BM Date:  (PTA)  Weight change: Filed Weights   02/19/14 0400 02/20/14 0459 02/21/14 0500  Weight: 111 lb 5.3 oz (50.5 kg) 118 lb 13.3 oz (53.9 kg) 127 lb 10.3 oz (57.9 kg)    Intake/Output:   Intake/Output Summary (Last 24 hours) at 02/21/14 0652 Last data filed at 02/21/14 0600  Gross per 24 hour  Intake 4334.39 ml  Output    320 ml  Net 4014.39 ml     Physical Exam: CVP 7 General:  Intubated. Sedated.  HEENT: normal x for mild exopthalmossis Neck: supple. JVP 7-8 . Carotids 2+ bilat; no bruits. No lymphadenopathy or thryomegaly appreciated. Cor: PMI nondisplaced. Tachy regular. + summation gallop Lungs: clear Abdomen: soft, nontender, nondistended. No hepatosplenomegaly.  No bruits or masses. Good bowel sounds. Extremities: no cyanosis, clubbing, rash, edema warm R groin IABP Neuro: alert & orientedx3, cranial nerves grossly intact. moves all 4 extremities w/o difficulty. Affect pleasant GU: Foley  Telemetry: ST 130-140s  Labs: Basic Metabolic Panel:  Recent Labs Lab 02/15/2014 1950 02/19/14 0420 02/19/14 1000 02/19/14 1113 02/19/14 1725 02/19/14 2313 02/20/14 0425 02/20/14 1730 02/21/14 0430  NA 140 136* 139  --  145  --  140 139 136*  K 4.4 3.1* 3.5*  --  4.4  --  3.5* 4.1 4.1  CL 95* 93* 95*  --  101  --  98 100 95*  CO2 13* 24 30  --  13*  --  26 23 22   GLUCOSE 98 146* 133*  --  189*  --  131* 114* 129*  BUN 21 21 22   --  22  --  29* 34* 40*  CREATININE 1.36* 1.04 1.03  --  1.31*  --  1.67* 1.95* 2.31*  CALCIUM 8.7 8.2* 8.0*  --  9.4  --  8.3* 7.3* 7.9*  MG 1.9 1.5  --  2.2  --  1.9  --   --   --   PHOS 5.8*  --   --  3.8  --  3.2  --   --   --     Liver Function Tests:  Recent Labs Lab 02/21/2014 1950 02/21/14 0430  AST 72* 317*  ALT 23 78*  ALKPHOS 134* 101  BILITOT 5.2* 7.8*  PROT 7.3  5.2*  ALBUMIN 2.3* 1.9*   No results found for this basename: LIPASE, AMYLASE,  in the last 168 hours No results found for this basename: AMMONIA,  in the last 168 hours  CBC:  Recent Labs Lab 02/17/2014 1950 02/19/14 0420 02/19/14 1725 02/20/14 0425 02/21/14 0430  WBC 12.8* 8.6 17.1* 24.6* 23.4*  NEUTROABS 11.0*  --   --   --   --   HGB 11.3* 12.4 10.1* 11.4* 9.3*  HCT 33.9* 36.1 30.3* 33.5* 27.3*  MCV 87.8 83.8 88.6 84.2 84.8  PLT 121* PLATELET CLUMPS NOTED ON SMEAR, COUNT APPEARS DECREASED 79* 65* 47*    Cardiac Enzymes:  Recent Labs Lab 02/05/2014 1943 02/19/14 0008 02/19/14 0743  TROPONINI <0.30 <0.30 <0.30    BNP: BNP (last 3 results)  Recent Labs  02/11/2014 1950  PROBNP 21308.620863.0*     Other results:    Imaging: Dg Chest Port 1 View  02/20/2014   CLINICAL DATA:  Status post cardiac arrest.  EXAM: PORTABLE CHEST  - 1 VIEW  COMPARISON:  February 19, 2014.  FINDINGS: Cardiomediastinal silhouette appears normal. Endotracheal tube is in good position with distal tip 6 cm above the carina. Nasogastric tube is seen in proximal stomach. Defibrillator pad is seen over the heart. No pneumothorax or significant pleural effusion is noted. Left subclavian catheter tip is seen in expected position of the SVC. Minimal interstitial densities are noted in both lungs, with left worse than right, concerning for possible edema. Bony thorax appears intact. Linear metallic density is seen to the left of the thoracic spine which may represent aortic balloon catheter tip or other foreign body.  IMPRESSION: Stable support apparatus. Minimal interstitial densities seen in both lungs concerning for possible pulmonary edema. Linear metallic density seen to the left of the thoracic spine which may represent aortic balloon catheter tip, but some form of foreign body cannot be excluded if patient does not have such a catheter.   Electronically Signed   By: Roque LiasJames  Green M.D.   On: 02/20/2014 07:57   Dg Chest Port 1 View  02/19/2014   CLINICAL DATA:  Hypoxia  EXAM: PORTABLE CHEST - 1 VIEW  COMPARISON:  February 18, 2014  FINDINGS: The endotracheal tube tip is 3.5 cm above the carina. Central catheter tip is in the superior vena cava. Nasogastric tube tip and side port are in the stomach. No pneumothorax.  There is no edema or consolidation. Heart is upper normal in size with normal pulmonary vascularity. No adenopathy. No bone lesions.  IMPRESSION: Tube and catheter positions as described without pneumothorax. No edema or consolidation.   Electronically Signed   By: Bretta BangWilliam  Woodruff M.D.   On: 02/19/2014 12:35   [place] Abdomen Limited Ruq  02/20/2014   CLINICAL DATA:  Jaundice. Ventilator patient, with intra aortic balloon pump. Query cholecystitis.  EXAM: US ABDOMEN LIMITED - RIGHT UPPER QUADRANT  COMPARISON:  None.  FINDINGS: Gallbladder:  Gallbladder wall  thickening at 6 mm. Amorphous echogenic material within the gallbladder compatible with sludge. No gallstones identified. Sonographic Murphy sign indeterminate due to ventilator.  Common bile duct:  Diameter: 4 mm  Liver:  Unremarkable.  Positional maneuvers could not be performed due to the patient's condition.  IMPRESSION: 1. There is gallbladder wall thickening with sludge within the gallbladder. No gallstones are seen. Sonographic Murphy sign indeterminate due to ventilator. Gallbladder wall thickening can be due to cholecystitis but can also be due to other conditions such as hypoalbuminemia, which the patient has.  If clinically warranted, nuclear medicine hepatobiliary scan could be utilized to assess for patency of the cystic duct as a more specific indicator of cholecystitis.   Electronically Signed   By: Herbie Baltimore M.D.   On: 02/20/2014 21:21     Medications:     Scheduled Medications: . antiseptic oral rinse  15 mL Mouth Rinse QID  . chlorhexidine  15 mL Mouth Rinse BID  . feeding supplement (PRO-STAT SUGAR FREE 64)  60 mL Per Tube BID  . hydrocortisone sod succinate (SOLU-CORTEF) inj  50 mg Intravenous Q6H  . pantoprazole (PROTONIX) IV  40 mg Intravenous Q24H  . piperacillin-tazobactam (ZOSYN)  IV  3.375 g Intravenous 3 times per day  . vancomycin  750 mg Intravenous Q24H    Infusions: . sodium chloride 20 mL/hr at 02/20/14 0800  . amiodarone 30 mg/hr (02/21/14 0122)  . feeding supplement (VITAL AF 1.2 CAL) 1,000 mL (02/20/14 1615)  . heparin 700 Units/hr (02/21/14 0533)  . milrinone 0.375 mcg/kg/min (02/20/14 1951)  . norepinephrine (LEVOPHED) Adult infusion 35 mcg/min (02/21/14 0609)  . vasopressin (PITRESSIN) infusion - *FOR SHOCK* 0.03 Units/min (02/20/14 1450)    PRN Medications: fentaNYL, midazolam   Assessment:  1. Acute Respiratory Failure- Intubated  2. Cardiogenic Shock  3. Cardiac arrest - S/P PEA  4. V Tach  5. HIV (CD4 28)  6. Hyperthyroidism -  thyroid storm 7. Acute Systolic Heart Failure EF 10%  8. Thrombocytopenia 9. Acute renal failure  Plan/Discussion:    Length of Stay: 3 CLEGG,AMY 02/21/2014, 6:52 AM  Advanced Heart Failure Team Pager [phone] (M-F; 7a - 4p)  Please contact [facility] for night-coverage after hours (4p -7a ) and weekends on [email or web address]  Patient remains critically ill with multi-system organ failure. Now with progressive hypotension and renal failure despite high-dose pressors and IABP support. I think we are nearing the point of futility as she likely will not be able to tolerate CVVHD if needed. Chances of meaningful recovery diminishingly small. I have d/w Dr. Sung Amabile of [facility] who agrees and will speak to [facility]. Will continue IABP for now until further decisions made.   The patient is critically ill with multiple organ systems failure and requires high complexity decision making for assessment and support, frequent evaluation and titration of therapies, application of advanced monitoring technologies and extensive interpretation of multiple databases.   Critical Care Time personally devoted to patient care services described in this note is 40 Minutes.  Daniel Bensimhon,MD 10:16 AM

## 2014-02-21 NOTE — Progress Notes (Signed)
02/21/2014 Family at bedside [place], Linnell Fulling

## 2014-02-21 NOTE — Progress Notes (Signed)
02/21/2014 1400   Dr. Bard Herbert notified of pressures on IABP and BP. Orders received. [place], Linnell Fulling

## 2014-02-21 NOTE — Progress Notes (Addendum)
CSW consulted because pt has HIV. Pt on vent, pressors, and is not medically appropriate for CSW assessment at this time as pt is not able to participate in assessment/counseling. CSW will follow up at a later date.   Maryclare Labrador, MSW, [facility] Clinical Social Worker [phone]

## 2014-02-21 NOTE — Progress Notes (Signed)
[facility]    Date of Admission:  2014-05-15   Total days of antibiotics 4        Day 4 vanco        Day 4 piptazo           ID: Michelle Flores is a 43 y.o. female with HIV/AIDS, Cd 4 count of 240/VL 192K recently restarted on stribild  admittedf or sepsis, thyrotoxicosis, EF 10%, VT arrest, IABP placed Active Problems:   Shock   Acute respiratory failure with hypoxia   Protein-calorie malnutrition, severe   Cardiogenic shock   Cardiac arrest   Acute systolic heart failure   Cardiomyopathy    Subjective: Currently on 2nd day of IABP. Afebrile. Remains critically ill. Family decided to make her DNR due to worsening response on IABP. Increasing pressor needs  Labs show worsening Cr likely due to hypotension.ongoing jaundice   Medications:  . albumin human  25 g Intravenous Q6H  . antiseptic oral rinse  15 mL Mouth Rinse QID  . chlorhexidine  15 mL Mouth Rinse BID  . feeding supplement (PRO-STAT SUGAR FREE 64)  60 mL Per Tube BID  . hydrocortisone sod succinate (SOLU-CORTEF) inj  50 mg Intravenous Q6H  . pantoprazole sodium  40 mg Per Tube Daily  . piperacillin-tazobactam (ZOSYN)  IV  3.375 g Intravenous 3 times per day  . vancomycin  750 mg Intravenous Q24H    Objective: Vital signs in last 24 hours: Temp:  [97.7 F (36.5 C)-98.6 F (37 C)] 98.6 F (37 C) (06/17 0733) Pulse Rate:  [122-133] 133 (06/17 0903) Resp:  [19-22] 22 (06/17 1000) BP: (103)/(33) 103/33 mmHg (06/16 1158) SpO2:  [99 %-100 %] 100 % (06/17 1000) Arterial Line BP: (88-110)/(30-41) 91/41 mmHg (06/17 1000) FiO2 (%):  [30 %] 30 % (06/17 0903) Weight:  [127 lb 10.3 oz (57.9 kg)] 127 lb 10.3 oz (57.9 kg) (06/17 0500)      Lab Results  Recent Labs  02/20/14 0425 02/20/14 1730 02/21/14 0430  WBC 24.6*  --  23.4*  HGB 11.4*  --  9.3*  HCT 33.5*  --  27.3*  NA 140 139 136*  K 3.5* 4.1 4.1  CL 98 100 95*  CO2 26 23 22   BUN 29* 34* 40*  CREATININE 1.67* 1.95*  2.31*   Liver Panel  Recent Labs  2013-11-08 1950 02/21/14 0430  PROT 7.3 5.2*  ALBUMIN 2.3* 1.9*  AST 72* 317*  ALT 23 78*  ALKPHOS 134* 101  BILITOT 5.2* 7.8*    Microbiology: 6/14 blood cx ngtd 6/14 urine cx 70,000 ecoli 6/14 sputum cx strep pneumoniae Studies/Results: Dg Chest Port 1 View  02/21/2014   CLINICAL DATA:  Respiratory failure, shortness of breath  EXAM: PORTABLE CHEST - 1 VIEW  COMPARISON:  02/20/2014  FINDINGS: An endotracheal tube is again seen 4.9 cm above the carina. A nasogastric catheter is noted coursing into the stomach. A left subclavian central line is seen in the mid superior vena cava. An intra-aortic balloon pump is now noted just below the aortic knob and stable. Multiple overlying monitoring devices are seen. The cardiac shadow is stable. Central vascular congestion remains. Mild left basilar atelectasis is noted.  IMPRESSION: Tubes and lines as described.  Mild left basilar atelectasis as well as overlying vascular congestion.   Electronically Signed   By: Alcide CleverMark  Lukens M.D.   On: 02/21/2014 07:45   Dg Chest Port 1 View  02/20/2014  CLINICAL DATA:  Status post cardiac arrest.  EXAM: PORTABLE CHEST - 1 VIEW  COMPARISON:  February 19, 2014.  FINDINGS: Cardiomediastinal silhouette appears normal. Endotracheal tube is in good position with distal tip 6 cm above the carina. Nasogastric tube is seen in proximal stomach. Defibrillator pad is seen over the heart. No pneumothorax or significant pleural effusion is noted. Left subclavian catheter tip is seen in expected position of the SVC. Minimal interstitial densities are noted in both lungs, with left worse than right, concerning for possible edema. Bony thorax appears intact. Linear metallic density is seen to the left of the thoracic spine which may represent aortic balloon catheter tip or other foreign body.  IMPRESSION: Stable support apparatus. Minimal interstitial densities seen in both lungs concerning for possible  pulmonary edema. Linear metallic density seen to the left of the thoracic spine which may represent aortic balloon catheter tip, but some form of foreign body cannot be excluded if patient does not have such a catheter.   Electronically Signed   By: Roque Lias M.D.   On: 02/20/2014 07:57   Dg Chest Port 1 View  02/19/2014   CLINICAL DATA:  Hypoxia  EXAM: PORTABLE CHEST - 1 VIEW  COMPARISON:  03/11/2014  FINDINGS: The endotracheal tube tip is 3.5 cm above the carina. Central catheter tip is in the superior vena cava. Nasogastric tube tip and side port are in the stomach. No pneumothorax.  There is no edema or consolidation. Heart is upper normal in size with normal pulmonary vascularity. No adenopathy. No bone lesions.  IMPRESSION: Tube and catheter positions as described without pneumothorax. No edema or consolidation.   Electronically Signed   By: Bretta Bang M.D.   On: 02/19/2014 12:35   US Abdomen Limited Ruq  02/20/2014   CLINICAL DATA:  Jaundice. Ventilator patient, with intra aortic balloon pump. Query cholecystitis.  EXAM: US ABDOMEN LIMITED - RIGHT UPPER QUADRANT  COMPARISON:  None.  FINDINGS: Gallbladder:  Gallbladder wall thickening at 6 mm. Amorphous echogenic material within the gallbladder compatible with sludge. No gallstones identified. Sonographic Murphy sign indeterminate due to ventilator.  Common bile duct:  Diameter: 4 mm  Liver:  Unremarkable.  Positional maneuvers could not be performed due to the patient's condition.  IMPRESSION: 1. There is gallbladder wall thickening with sludge within the gallbladder. No gallstones are seen. Sonographic Murphy sign indeterminate due to ventilator. Gallbladder wall thickening can be due to cholecystitis but can also be due to other conditions such as hypoalbuminemia, which the patient has. If clinically warranted, nuclear medicine hepatobiliary scan could be utilized to assess for patency of the cystic duct as a more specific indicator of  cholecystitis.   Electronically Signed   By: Herbie Baltimore M.D.   On: 02/20/2014 21:21     Assessment/Plan: 43yo F with advanced HIV, cardiogenic shock with IABP and continues to be in extremis  Pneumonia = will continue with treatment with piptazo to not only cover strep pneumonia but also cholangitis. Given her worsening status from cardiogenic shock, do not feel that this is consistent with PCP. Still supratherapeutic on vancomycin  aki = worsening likely related to cardiogenic shock.  Presumed cholangitis = continue with piptazo  HIV =poorly controlled prior to admit, currently off of ART  Disposition = critically ill and unlikely to recover given her current needs of high vasopressors with her IABP. Recommend to provide continue support to the family in this time. Provide family with access to chaplain.  Drue Second [facility] Infect[facility] Diseases Cell: [phone] Pager: [phone]  02/21/2014, 10:17 AM

## 2014-02-21 NOTE — Progress Notes (Signed)
ANTICOAGULATION CONSULT NOTE Pharmacy Consult for heparin Indication: IABP    Allergies  Allergen Reactions  . Diphenhydramine Hcl Other (See Comments)    Per family, reaction unknown   . Latex Other (See Comments)    Per family, reaction unknown   . Sulfonamide Derivatives Other (See Comments)    Per family, reaction unknown     Patient Measurements: Height: 5\' 5"  (165.1 cm) Weight: 118 lb 13.3 oz (53.9 kg) IBW/kg (Calculated) : 57 Heparin Dosing Weight: 54kg  Vital Signs: Temp: 98 F (36.7 C) (06/17 0000) Temp src: Oral (06/17 0000) Pulse Rate: 133 (06/17 0421)  Labs:  Recent Labs  02/27/14 1943 February 27, 2014 1950 02/19/14 0008  02/19/14 0743  02/19/14 1725 02/20/14 0425 02/20/14 1551 02/20/14 1730 02/20/14 2210 02/21/14 0430  HGB  --  11.3*  --   < >  --   --  10.1* 11.4*  --   --   --  9.3*  HCT  --  33.9*  --   < >  --   --  30.3* 33.5*  --   --   --  27.3*  PLT  --  121*  --   < >  --   --  79* 65*  --   --   --  47*  APTT  --  32  --   --   --   --   --   --   --   --   --   --   LABPROT  --  24.7*  --   --   --   --   --   --   --   --   --  23.8*  INR  --  2.32*  --   --   --   --   --   --   --   --   --  2.21*  HEPARINUNFRC  --   --   --   --   --   --   --   --  0.22*  --  0.23* 0.16*  CREATININE  --  1.36*  --   < >  --   < > 1.31* 1.67*  --  1.95*  --  2.31*  TROPONINI <0.30  --  <0.30  --  <0.30  --   --   --   --   --   --   --   < > = values in this interval not displayed.  Estimated Creatinine Clearance: 27.3 ml/min (by C-G formula based on Cr of 2.31).  Assessment: 43 yo female with cardiogenic shock on IABP for heparin  Goal of Therapy:  Heparin level 0.2-0.5 units/ml Monitor platelets by anticoagulation protocol: Yes   Plan:  Increase Heparin 700 units/hr  Geannie Risen, PharmD, BCPS  02/21/2014 5:31 AM

## 2014-02-21 NOTE — Progress Notes (Deleted)
60 YOF with HIV (last CD4 T Cell abs 100, HIV RNA Quant 191K on 01/02/14) admitted from [facility] for pneumonia. 6/16:events overnight - VT/PEA arrest, IABP placed 6/15  Anticoagulation: sq heparin>>start IV heparin for IABP, no bleeding issues noted, h/h stable, plt trending down -65, INR 2.21, liver dysfunction  Infectious Disease: r/o PNA, wbc 12.8>23.4, afeb, Last CD4 100, VL 191K - not taking Striblid per ID notes on 6/2 due to cost/attainability. UA dirty. Dapsone held d/t Tbili elevated and slight elevated LFT with jaundice looking. LA 17>>3.8  Vanc 6/14 >> Zosyn 6/14 >>  LQ 6/14 >>6/15 Flagyl 6/14 >>6/15 Azithromycin 6/15 >>6/15  Urine 6/14 >>ecoli pan sens except amp, septra Bld 6/14 >> Resp 6/14 >> strep pneumo CMV 6/15 >> ip  MRSA PCR: neg  Cardiovascular: Hypotensive post-intubation requiring pressors.VT/PEA arrest s/p cpr x2 - amio gtt, Levophed- 38 - titrate down as tolerate ECHO 6/15 shows EF 15%, IABP placed 6/15 at bedside; Coox 82, cvp 5 ST - hr 120s  Endocrinology: CBG at goal. Solu-cortef. Thyroid storm - Lugol's, PTU. T4 - 1.82 (1.8 uln) Gastrointestinal / Nutrition: jaundice, elevated tbili (6.1) & INR 2.32, and abdominal pain. Abd CT shows gallbladder thickening. Ppi. Abd [place] today 6/16. Tube feeds, LBM pta - change ppi to per tube 6/17  Neurology: RASS -2, GCS 6. Versed/Fent gtt Nephrology: SCr 0.8>1.6, K 3.5 - replacing.  Pulmonary: Intubated FiO2 40. Pulm edema Hematology / Oncology: hgb 11, plts 67 PTA Medication Issues Best Practices: heparin, ppi, mc  Plan:  1. Vancomycin 750mg  q24 2. Zosyn 3.375g IV q8h- 4 hr infusion  3. Consider PCP treatment w/ Pred + (note sulfonamide allergy --> alt Clinda 600 IV q8h + Primaquine 30 po q24 vs Pentamidine 4mg /kg/day) - f/u ID recs for OI 4. Note done 6/16 5. F/u repeat T4?  6. CPR x 2, bedside balloon pump

## 2014-02-21 NOTE — Progress Notes (Signed)
PULMONARY / CRITICAL CARE MEDICINE  Name: Michelle Flores MRN: [id number] DOB: 05/03/1971    ADMISSION DATE:  02/19/2014 CONSULTATION DATE:  02/07/2014  REFERRING MD :  EDP PRIMARY SERVICE:  PCCM  CHIEF COMPLAINT:  Acute respiratory failure  BRIEF PATIENT DESCRIPTION: 43 yo HIV+ (followed by Dr. Orvan Falconer) presented to [facility] with nausea, vomiting and abdominal pain. Found to have elevated bilirubin. Presented to [facility] ED 6/14 with dyspnea, hypoxia, bilateral pulmonary infiltrates, severe acidosis, hypotension. Intubated. Hypotensive post-intubation requiring vasopressors.  SIGNIFICANT EVENTS / STUDIES:  6/14  Limited echocardiogram:  severe global myocardial hypokinesis.  6/14 TSH < .008, free T4 1.82 (minimally elevated), free T3 91 (normal) 6/15  CTAP: Gallbladder wall edematous, colitis 6/15  Echocardiogram:  LVEF 15% 6/15 PEA, then VF arrest. Prolonged ACLS (epi X 4, Ca2+, HCO3). Persistent shock requiring max dose vasopressors. Amiodarone and milrinone initiated. Emergent IABP placed. Transferred to CCU 6/16 minimally responsive, requiring max dose vasopressors, oligo-anuric with worsening renal function indices 6/17 Continued deterioration with progressive shock resistant to maximum dose vasopressors. Anuric. LFTs rising. Comatose 6/17 Lengthy discussions with pt's pastor and his wife who have been very close to pt. Also spoke with pt's cousin over speaker phone during the conference. Explained that therapies are "maxed out" and that ACLS/CPR would be futile. Pt not deemed to be candidate for CRRT due to severely unstable status. Plan for cousin to arrive from [place] and an uncle from [place]. Plane to meet again 618 AM with likely withdrawal of support unless there are signs of improvement  LINES / TUBES: ETT 6/14 >>  L Simpson CVL 6/14 >>  R femoral A-line 6/15 >>  IABP, L femoral artery 6/15 >>   CULTURES: Urine 6/14 >> E coli Resp 6/14 >> mod strep pneumoniae Blood 6/14 >>    ANTIBIOTICS: Flagyl 6/14 >> 6/15 Zosyn 6/14 >>  Vancomycin 6/14 >>    INTERVAL HISTORY:   Minimally responsive off all sedative/analgesics  VITAL SIGNS: Temp:  [97.8 F (36.6 C)-99.1 F (37.3 C)] 99.1 F (37.3 C) (06/17 1147) Pulse Rate:  [128-139] 139 (06/17 1200) Resp:  [19-23] 23 (06/17 1300) SpO2:  [99 %-100 %] 100 % (06/17 1300) Arterial Line BP: (88-103)/(30-41) 98/37 mmHg (06/17 1300) FiO2 (%):  [30 %] 30 % (06/17 1200) Weight:  [57.9 kg (127 lb 10.3 oz)] 57.9 kg (127 lb 10.3 oz) (06/17 0500)  HEMODYNAMICS: CVP:  [3 mmHg-8 mmHg] 8 mmHg  VENTILATOR SETTINGS: Vent Mode:  [-] PRVC FiO2 (%):  [30 %] 30 % Set Rate:  [20 bmp] 20 bmp Vt Set:  [400 mL] 400 mL PEEP:  [5 cmH20] 5 cmH20 Plateau Pressure:  [15 cmH20-17 cmH20] 15 cmH20  INTAKE / OUTPUT: Intake/Output     06/16 0701 - 06/17 0700 06/17 0701 - 06/18 0700   I.V. (mL/kg) 2515.3 (43.4) 646.4 (11.2)   NG/GT 331.3 150   IV Piggyback 2012.5 500   Total Intake(mL/kg) 4859.1 (83.9) 1296.4 (22.4)   Urine (mL/kg/hr) 320 (0.2)    Total Output 320 0   Net +4539.1 +1296.4        Stool Occurrence 2 x      PHYSICAL EXAMINATION: General:  Minimally responsive Neuro:  No spont movement, minimal withdrawal HEENT:  PERRL, EOMI, NCAT Cardiovascular:  Tachy, freq extrasystoles, HS obscured by IABP Lungs: clear anteriorly Abdomen:  Soft, nontender, bowel sounds diminished Ext: no edema, cool  LABS: I have reviewed all of today's lab results. Relevant abnormalities are discussed in the A/P section  CXR: Mild left basilar atelectasis as well as overlying vascular congestion.   ASSESSMENT / PLAN:  PULMONARY A:   Acute respiratory failure P:   Cont full vent support - settings reviewed and/or adjusted Cont vent bundle Daily SBT if/when meets criteria  CARDIOVASCULAR A:  Severe cardiomyopathy, unclear etiology Cardiogenic shock refractory to vasopressors/inotropes NSVT 6/14   S/P PEA, then VF arrest  6/15 P:  Cards assisting Wean pressors for MAP > 65 mmHg Cont amiodarone Cont milrinone Cont empiric stress dose steroids Cont IABP  RENAL A:   Severe lactic acidosis Acute renal failure, anuric Hypokalemia, resolved Hypomagnesemia, resolved P:   Monitor BMET intermittently Monitor I/Os Correct electrolytes as indicated Not a candidate for CRRT presently  GASTROINTESTINAL A:   Elevated bilirubin Shock liver Possible cholecystitis Possible colitis P:   SUP: IV PPI Trickle TFs Monitor LFTs RUQ US ordered  HEMATOLOGIC A:   Coagulopathy, likely due to hepatic failure Anemia without acute blood loss Worsening thrombocytopenia VTE P:  DVT px: SCDs Monitor CBC intermittently Transfuse per usual ICU guidelines  INFECTIOUS A:   HIV+ (CD4 11) Possible cholecystitis Possible colitis Doubt pneumonia P:   Micro and abx as above  ENDOCRINE  A:   Low TSH but normal T3 and minimally elevated T4 Doubt thyroid storm P:   Monitor TFTs CBGs q 6 hrs SSI if needed for glu > 180   NEUROLOGIC A:   Severe acute encephalopathy P:   Avoid cont sedative/analgesics Further eval to be dictated by course of illness Daily WUA  I have personally obtained history, examined patient, evaluated and interpreted laboratory and imaging results, reviewed medical records, formulated assessment / plan and placed orders.  CRITICAL CARE:  The patient is critically ill with multiple organ systems failure and requires high complexity decision making for assessment and support, frequent evaluation and titration of therapies, application of advanced monitoring technologies and extensive interpretation of multiple databases. Critical Care Time devoted to patient care services described in this note is 60 minutes.   Billy Fischeravid Simonds, MD ; [facility] service Mobile [phone].  After 5:30 PM or weekends, call [phone]   02/21/2014, 2:02 PM

## 2014-02-21 NOTE — Progress Notes (Signed)
Dr.McQuaid notified of the patient's increasing BUN and Crea level as well as her UOP of 10cc /hr.

## 2014-02-21 NOTE — Progress Notes (Signed)
ANTICOAGULATION CONSULT NOTE - Initial Consult  Pharmacy Consult for heparin Indication: IABP    Allergies  Allergen Reactions  . Diphenhydramine Hcl Other (See Comments)    Per family, reaction unknown   . Latex Other (See Comments)    Per family, reaction unknown   . Sulfonamide Derivatives Other (See Comments)    Per family, reaction unknown     Patient Measurements: Height: 5\' 5"  (165.1 cm) Weight: 127 lb 10.3 oz (57.9 kg) IBW/kg (Calculated) : 57 Heparin Dosing Weight: 54kg  Vital Signs: Temp: 98.7 F (37.1 C) (06/17 1600) Temp src: Oral (06/17 1600) BP: 111/51 mmHg (06/17 2000) Pulse Rate: 88 (06/17 1600)  Labs:  Recent Labs  02/19/14 0008  02/19/14 0743  02/19/14 1725 02/20/14 0425  02/20/14 1730  02/21/14 0430 02/21/14 1045 02/21/14 1957  HGB  --   < >  --   --  10.1* 11.4*  --   --   --  9.3*  --   --   HCT  --   < >  --   --  30.3* 33.5*  --   --   --  27.3*  --   --   PLT  --   < >  --   --  79* 65*  --   --   --  47*  --   --   LABPROT  --   --   --   --   --   --   --   --   --  23.8*  --   --   INR  --   --   --   --   --   --   --   --   --  2.21*  --   --   HEPARINUNFRC  --   --   --   --   --   --   < >  --   < > 0.16* 0.20* 0.10*  CREATININE  --   < >  --   < > 1.31* 1.67*  --  1.95*  --  2.31*  --   --   TROPONINI <0.30  --  <0.30  --   --   --   --   --   --   --   --   --   < > = values in this interval not displayed.  Estimated Creatinine Clearance: 28.8 ml/min (by C-G formula based on Cr of 2.31).   Medical History: No past medical history on file.  Assessment: On heparin for IABP. 43 year old female who is s/p Emergent IABP placed at bedside on 02/19/14 after she presented to MICU on 6/14 with respiratory failure and thyroid storm. Earlier heparin level was within goal of 0.2-0.5 units/ml. Repeat level is now sub-therapeutic at 0.1 despite slight increase in heparin infusion rate to 750 units/hr. RN reports no interruptions in heparin  infusion. No overt s/s of bleeding reported.    Goal of Therapy:  Heparin level 0.2-0.5 units/ml Monitor platelets by anticoagulation protocol: Yes   Plan:   Increase heparin to 900 units/hr.  Recheck heparin level in ~ 6 hours to confirm remains therapeutic then daily heparin level and CBC.   Vinnie Level, PharmD.  Clinical Pharmacist Pager [phone]

## 2014-02-21 NOTE — Progress Notes (Addendum)
ANTICOAGULATION CONSULT NOTE - Initial Consult  Pharmacy Consult for heparin Indication: IABP    Allergies  Allergen Reactions  . Diphenhydramine Hcl Other (See Comments)    Per family, reaction unknown   . Latex Other (See Comments)    Per family, reaction unknown   . Sulfonamide Derivatives Other (See Comments)    Per family, reaction unknown     Patient Measurements: Height: 5\' 5"  (165.1 cm) Weight: 127 lb 10.3 oz (57.9 kg) IBW/kg (Calculated) : 57 Heparin Dosing Weight: 54kg  Vital Signs: Temp: 99.1 F (37.3 C) (06/17 1147) Temp src: Oral (06/17 1147) Pulse Rate: 133 (06/17 0903)  Labs:  Recent Labs  02/23/2014 1943 02/09/2014 1950 02/19/14 0008  02/19/14 0743  02/19/14 1725 02/20/14 0425  02/20/14 1730 02/20/14 2210 02/21/14 0430 02/21/14 1045  HGB  --  11.3*  --   < >  --   --  10.1* 11.4*  --   --   --  9.3*  --   HCT  --  33.9*  --   < >  --   --  30.3* 33.5*  --   --   --  27.3*  --   PLT  --  121*  --   < >  --   --  79* 65*  --   --   --  47*  --   APTT  --  32  --   --   --   --   --   --   --   --   --   --   --   LABPROT  --  24.7*  --   --   --   --   --   --   --   --   --  23.8*  --   INR  --  2.32*  --   --   --   --   --   --   --   --   --  2.21*  --   HEPARINUNFRC  --   --   --   --   --   --   --   --   < >  --  0.23* 0.16* 0.20*  CREATININE  --  1.36*  --   < >  --   < > 1.31* 1.67*  --  1.95*  --  2.31*  --   TROPONINI <0.30  --  <0.30  --  <0.30  --   --   --   --   --   --   --   --   < > = values in this interval not displayed.  Estimated Creatinine Clearance: 28.8 ml/min (by C-G formula based on Cr of 2.31).   Medical History: No past medical history on file.  Assessment: On heparin for IABP. Currently level is within the goal of 0.2-0.5 units/ml for this 43 year old female who is s/p Emergent IABP placed at bedside on 02/19/14 after she presented to MICU on 6/14 with respiratory failure and thyroid storm.    Goal of Therapy:   Heparin level 0.2-0.5 units/ml Monitor platelets by anticoagulation protocol: Yes   Plan:   Increase heparin to 750 units/hr.  Recheck heparin level in ~ 6 hours to confirm remains therapeutic then daily heparin level and CBC.   Addendum:  Vanc trough came back at 31. Her scr has bumped up to 2.31. Will hold vanc today and check random level  in AM.   Plan  Hold vanc Change zosyn to 2.25g IV q6

## 2014-02-22 ENCOUNTER — Inpatient Hospital Stay (HOSPITAL_COMMUNITY): Payer: Medicaid Other

## 2014-02-22 DIAGNOSIS — N179 Acute kidney failure, unspecified: Secondary | ICD-10-CM

## 2014-02-22 DIAGNOSIS — E43 Unspecified severe protein-calorie malnutrition: Secondary | ICD-10-CM

## 2014-02-22 LAB — CBC
HCT: 23.9 % — ABNORMAL LOW (ref 36.0–46.0)
HEMOGLOBIN: 8.2 g/dL — AB (ref 12.0–15.0)
MCH: 28.5 pg (ref 26.0–34.0)
MCHC: 34.3 g/dL (ref 30.0–36.0)
MCV: 83 fL (ref 78.0–100.0)
Platelets: 36 10*3/uL — ABNORMAL LOW (ref 150–400)
RBC: 2.88 MIL/uL — ABNORMAL LOW (ref 3.87–5.11)
RDW: 21.4 % — AB (ref 11.5–15.5)
WBC: 20.2 10*3/uL — ABNORMAL HIGH (ref 4.0–10.5)

## 2014-02-22 LAB — BLOOD GAS, ARTERIAL
ACID-BASE DEFICIT: 5.2 mmol/L — AB (ref 0.0–2.0)
Bicarbonate: 19.4 mEq/L — ABNORMAL LOW (ref 20.0–24.0)
DRAWN BY: 270221
FIO2: 0.3 %
LHR: 20 {breaths}/min
O2 Saturation: 100 %
PATIENT TEMPERATURE: 98.6
PEEP: 5 cmH2O
TCO2: 20.4 mmol/L (ref 0–100)
VT: 400 mL
pCO2 arterial: 35.5 mmHg (ref 35.0–45.0)
pH, Arterial: 7.356 (ref 7.350–7.450)
pO2, Arterial: 103 mmHg — ABNORMAL HIGH (ref 80.0–100.0)

## 2014-02-22 LAB — COMPREHENSIVE METABOLIC PANEL
ALBUMIN: 2.9 g/dL — AB (ref 3.5–5.2)
ALK PHOS: 92 U/L (ref 39–117)
ALT: 56 U/L — ABNORMAL HIGH (ref 0–35)
AST: 152 U/L — ABNORMAL HIGH (ref 0–37)
BUN: 55 mg/dL — ABNORMAL HIGH (ref 6–23)
CALCIUM: 8.2 mg/dL — AB (ref 8.4–10.5)
CO2: 18 mEq/L — ABNORMAL LOW (ref 19–32)
Chloride: 95 mEq/L — ABNORMAL LOW (ref 96–112)
Creatinine, Ser: 2.95 mg/dL — ABNORMAL HIGH (ref 0.50–1.10)
GFR calc non Af Amer: 19 mL/min — ABNORMAL LOW (ref >90)
GFR, EST AFRICAN AMERICAN: 22 mL/min — AB (ref >90)
GLUCOSE: 156 mg/dL — AB (ref 70–99)
POTASSIUM: 4.2 meq/L (ref 3.7–5.3)
Sodium: 137 mEq/L (ref 137–147)
TOTAL PROTEIN: 5.9 g/dL — AB (ref 6.0–8.3)
Total Bilirubin: 8.5 mg/dL — ABNORMAL HIGH (ref 0.3–1.2)

## 2014-02-22 LAB — GLUCOSE, CAPILLARY
GLUCOSE-CAPILLARY: 165 mg/dL — AB (ref 70–99)
GLUCOSE-CAPILLARY: 173 mg/dL — AB (ref 70–99)
Glucose-Capillary: 163 mg/dL — ABNORMAL HIGH (ref 70–99)

## 2014-02-22 LAB — CULTURE, RESPIRATORY W GRAM STAIN

## 2014-02-22 LAB — CULTURE, RESPIRATORY

## 2014-02-22 LAB — HEPARIN LEVEL (UNFRACTIONATED)
HEPARIN UNFRACTIONATED: 0.11 [IU]/mL — AB (ref 0.30–0.70)
Heparin Unfractionated: 0.18 IU/mL — ABNORMAL LOW (ref 0.30–0.70)

## 2014-02-22 LAB — VANCOMYCIN, RANDOM: VANCOMYCIN RM: 28.4 ug/mL

## 2014-02-22 MED ORDER — MORPHINE BOLUS VIA INFUSION
5.0000 mg | INTRAVENOUS | Status: DC | PRN
  Filled 2014-02-22: qty 20

## 2014-02-22 MED ORDER — MORPHINE SULFATE 10 MG/ML IJ SOLN
10.0000 mg/h | INTRAMUSCULAR | Status: DC
  Administered 2014-02-22: 10 mg/h via INTRAVENOUS
  Administered 2014-02-22: 20 mg/h via INTRAVENOUS
  Administered 2014-02-22: 10 mg/h via INTRAVENOUS
  Filled 2014-02-22 (×4): qty 10

## 2014-02-22 MED ORDER — LORAZEPAM BOLUS VIA INFUSION
2.0000 mg | INTRAVENOUS | Status: DC | PRN
  Filled 2014-02-22: qty 5

## 2014-02-25 LAB — CULTURE, BLOOD (ROUTINE X 2)
Culture: NO GROWTH
Culture: NO GROWTH

## 2014-03-06 ENCOUNTER — Ambulatory Visit: Payer: Self-pay | Admitting: Internal Medicine

## 2014-03-07 NOTE — Progress Notes (Addendum)
Chaplain responded to consult but pt had already passed and family had their own pastoral support present.  Chaplain ended the visit accordingly.     As of 19:36, patient is still alive. [place], Mitzi Hansen; RN

## 2014-03-07 NOTE — Progress Notes (Signed)
ANTICOAGULATION CONSULT NOTE Pharmacy Consult for heparin Indication: IABP    Allergies  Allergen Reactions  . Diphenhydramine Hcl Other (See Comments)    Per family, reaction unknown   . Latex Other (See Comments)    Per family, reaction unknown   . Sulfonamide Derivatives Other (See Comments)    Per family, reaction unknown     Patient Measurements: Height: 5\' 5"  (165.1 cm) Weight: 127 lb 10.3 oz (57.9 kg) IBW/kg (Calculated) : 57 Heparin Dosing Weight: 54kg  Vital Signs: Temp: 98.6 F (37 C) (06/18 0000) Temp src: Oral (06/18 0000) BP: 92/52 mmHg (06/18 0313) Pulse Rate: 133 (06/18 0313)  Labs:  Recent Labs  02/19/14 0743  02/19/14 1725 02/20/14 0425  02/20/14 1730  02/21/14 0430 02/21/14 1045 02/21/14 1957 02/06/2014 0400  HGB  --   < > 10.1* 11.4*  --   --   --  9.3*  --   --   --   HCT  --   --  30.3* 33.5*  --   --   --  27.3*  --   --   --   PLT  --   --  79* 65*  --   --   --  47*  --   --   --   LABPROT  --   --   --   --   --   --   --  23.8*  --   --   --   INR  --   --   --   --   --   --   --  2.21*  --   --   --   HEPARINUNFRC  --   --   --   --   < >  --   < > 0.16* 0.20* 0.10* 0.11*  CREATININE  --   < > 1.31* 1.67*  --  1.95*  --  2.31*  --   --   --   TROPONINI <0.30  --   --   --   --   --   --   --   --   --   --   < > = values in this interval not displayed.  Estimated Creatinine Clearance: 28.8 ml/min (by C-G formula based on Cr of 2.31).  Assessment: 43 yo female with cardiogenic shock on IABP for heparin  Goal of Therapy:  Heparin level 0.2-0.5 units/ml Monitor platelets by anticoagulation protocol: Yes   Plan:  Increase Heparin 1050 units/hr Check heparin level in 8 hours.  Geannie Risen, PharmD, BCPS  02/12/2014 4:52 AM

## 2014-03-07 NOTE — Progress Notes (Signed)
Advanced Heart Failure Rounding Note   Subjective:    Michelle Flores is a 43 year old with a history of HIV (2011)/AIDS (CD4 = 28), hyperthyroidism who presented to [facility] with N/V and abdominal pain. She had elevated bilirubin and was severely acidotic (lactic acid 16.9) requiring intubation and transfer to [facility]. After intubation she was hypotension and required vasopressor. Bedside TEE performed EF 10% severe global hypokinesis. Treatment initiated for hyperthyroidism with PTU, steroids and iodine.   Had VT, PEA arrest. IABP placed on 6/14.   Remains intubated/sedated. Non-responsive. Remains on levophed 37.5 and vasopressin. Anuric. Weight continues to trend up.   Creatinine 2.3>2.9     Objective:   Weight Range:  Vital Signs:   Temp:  [98.5 F (36.9 C)-99.1 F (37.3 C)] 98.6 F (37 C) (06/18 0400) Pulse Rate:  [88-144] 133 (06/18 0313) Resp:  [18-24] 23 (06/18 0500) BP: (92-111)/(47-52) 92/52 mmHg (06/18 0313) SpO2:  [98 %-100 %] 99 % (06/18 0500) Arterial Line BP: (53-116)/(17-61) 116/61 mmHg (06/18 0500) FiO2 (%):  [30 %] 30 % (06/18 0400) Weight:  [136 lb 7.4 oz (61.9 kg)] 136 lb 7.4 oz (61.9 kg) (06/18 0500) Last BM Date: 02/21/14  Weight change: Filed Weights   02/20/14 0459 02/21/14 0500 2014-03-19 0500  Weight: 118 lb 13.3 oz (53.9 kg) 127 lb 10.3 oz (57.9 kg) 136 lb 7.4 oz (61.9 kg)    Intake/Output:   Intake/Output Summary (Last 24 hours) at 2014-03-19 0624 Last data filed at 03/19/2014 0500  Gross per 24 hour  Intake 4342.72 ml  Output    630 ml  Net 3712.72 ml     Physical Exam: CVP 15 General:  Intubated. Sedated.  HEENT: normal x for mild exopthalmossis Neck: supple. JVP ear . Carotids 2+ bilat; no bruits. No lymphadenopathy or thryomegaly appreciated. Cor: PMI nondisplaced. Tachy regular. + summation gallop Lungs: clear Abdomen: soft, nontender, nondistended. No hepatosplenomegaly. No bruits or masses. Good bowel sounds. Extremities: no cyanosis,  clubbing, rash, 1+ edema warm R groin IABP Neuro: alert & orientedx3, cranial nerves grossly intact. moves all 4 extremities w/o difficulty. Affect pleasant GU: Foley  Telemetry: ST 130-140s  Labs: Basic Metabolic Panel:  Recent Labs Lab 02/23/2014 1950 02/19/14 0420  02/19/14 1113 02/19/14 1725 02/19/14 2313 02/20/14 0425 02/20/14 1730 02/21/14 0430 Mar 19, 2014 0400  NA 140 136*  < >  --  145  --  140 139 136* 137  K 4.4 3.1*  < >  --  4.4  --  3.5* 4.1 4.1 4.2  CL 95* 93*  < >  --  101  --  98 100 95* 95*  CO2 13* 24  < >  --  13*  --  26 23 22  18*  GLUCOSE 98 146*  < >  --  189*  --  131* 114* 129* 156*  BUN 21 21  < >  --  22  --  29* 34* 40* 55*  CREATININE 1.36* 1.04  < >  --  1.31*  --  1.67* 1.95* 2.31* 2.95*  CALCIUM 8.7 8.2*  < >  --  9.4  --  8.3* 7.3* 7.9* 8.2*  MG 1.9 1.5  --  2.2  --  1.9  --   --   --   --   PHOS 5.8*  --   --  3.8  --  3.2  --   --   --   --   < > = values in this interval not  displayed.  Liver Function Tests:  Recent Labs Lab 02/13/2014 1950 02/21/14 0430 03/01/14 0400  AST 72* 317* 152*  ALT 23 78* 56*  ALKPHOS 134* 101 92  BILITOT 5.2* 7.8* 8.5*  PROT 7.3 5.2* 5.9*  ALBUMIN 2.3* 1.9* 2.9*   No results found for this basename: LIPASE, AMYLASE,  in the last 168 hours No results found for this basename: AMMONIA,  in the last 168 hours  CBC:  Recent Labs Lab 02/05/2014 1950 02/19/14 0420 02/19/14 1725 02/20/14 0425 02/21/14 0430 2014-03-01 0400  WBC 12.8* 8.6 17.1* 24.6* 23.4* 20.2*  NEUTROABS 11.0*  --   --   --   --   --   HGB 11.3* 12.4 10.1* 11.4* 9.3* 8.2*  HCT 33.9* 36.1 30.3* 33.5* 27.3* 23.9*  MCV 87.8 83.8 88.6 84.2 84.8 83.0  PLT 121* PLATELET CLUMPS NOTED ON SMEAR, COUNT APPEARS DECREASED 79* 65* 47* 36*    Cardiac Enzymes:  Recent Labs Lab 02/06/2014 1943 02/19/14 0008 02/19/14 0743  TROPONINI <0.30 <0.30 <0.30    BNP: BNP (last 3 results)  Recent Labs  02/23/2014 1950  PROBNP 40981.1*     Other  results:    Imaging: Dg Chest Port 1 View  02/21/2014   CLINICAL DATA:  Respiratory failure, shortness of breath  EXAM: PORTABLE CHEST - 1 VIEW  COMPARISON:  02/20/2014  FINDINGS: An endotracheal tube is again seen 4.9 cm above the carina. A nasogastric catheter is noted coursing into the stomach. A left subclavian central line is seen in the mid superior vena cava. An intra-aortic balloon pump is now noted just below the aortic knob and stable. Multiple overlying monitoring devices are seen. The cardiac shadow is stable. Central vascular congestion remains. Mild left basilar atelectasis is noted.  IMPRESSION: Tubes and lines as described.  Mild left basilar atelectasis as well as overlying vascular congestion.   Electronically Signed   By: Alcide Clever M.D.   On: 02/21/2014 07:45   US Abdomen Limited Ruq  02/20/2014   CLINICAL DATA:  Jaundice. Ventilator patient, with intra aortic balloon pump. Query cholecystitis.  EXAM: US ABDOMEN LIMITED - RIGHT UPPER QUADRANT  COMPARISON:  None.  FINDINGS: Gallbladder:  Gallbladder wall thickening at 6 mm. Amorphous echogenic material within the gallbladder compatible with sludge. No gallstones identified. Sonographic Murphy sign indeterminate due to ventilator.  Common bile duct:  Diameter: 4 mm  Liver:  Unremarkable.  Positional maneuvers could not be performed due to the patient's condition.  IMPRESSION: 1. There is gallbladder wall thickening with sludge within the gallbladder. No gallstones are seen. Sonographic Murphy sign indeterminate due to ventilator. Gallbladder wall thickening can be due to cholecystitis but can also be due to other conditions such as hypoalbuminemia, which the patient has. If clinically warranted, nuclear medicine hepatobiliary scan could be utilized to assess for patency of the cystic duct as a more specific indicator of cholecystitis.   Electronically Signed   By: Herbie Baltimore M.D.   On: 02/20/2014 21:21     Medications:      Scheduled Medications: . antiseptic oral rinse  15 mL Mouth Rinse QID  . chlorhexidine  15 mL Mouth Rinse BID  . feeding supplement (PRO-STAT SUGAR FREE 64)  60 mL Per Tube BID  . hydrocortisone sod succinate (SOLU-CORTEF) inj  50 mg Intravenous Q6H  . pantoprazole sodium  40 mg Per Tube Daily  . piperacillin-tazobactam (ZOSYN)  IV  2.25 g Intravenous Q6H    Infusions: . sodium chloride 10  mL/hr at June 26, 2014 0300  . amiodarone 30 mg/hr (June 26, 2014 0000)  . feeding supplement (VITAL AF 1.2 CAL) Stopped (02/21/14 1500)  . heparin 1,050 Units/hr (June 26, 2014 0524)  . milrinone 0.375 mcg/kg/min (June 26, 2014 0500)  . norepinephrine (LEVOPHED) Adult infusion 40 mcg/min (June 26, 2014 0300)  . vasopressin (PITRESSIN) infusion - *FOR SHOCK* 0.03 Units/min (02/21/14 2000)    PRN Medications: fentaNYL, midazolam   Assessment:  1. Acute Respiratory Failure- Intubated  2. Cardiogenic Shock  3. Cardiac arrest - S/P PEA  4. V Tach  5. HIV (CD4 28)  6. Hyperthyroidism - thyroid storm 7. Acute Systolic Heart Failure EF 10%  8. Thrombocytopenia 9. Acute renal failure  Plan/Discussion:    Length of Stay: 4 CLEGG,AMY July 01, 2014, 6:24 AM  Advanced Heart Failure Team Pager [phone] (M-F; 7a - 4p)  Please contact [facility] for night-coverage after hours (4p -7a ) and weekends on [email or web address]  Patient seen and examined with Tonye BecketAmy Clegg, NP. We discussed all aspects of the encounter. I agree with the assessment and plan as stated above.   She is actively dying with multi-system organ failure despite mechanical support and high-dose pressors. Unfortunately, there are no realistic options for Michelle Flores. Would strongly consider comfort care.   The patient is critically ill with multiple organ systems failure and requires high complexity decision making for assessment and support, frequent evaluation and titration of therapies, application of advanced monitoring technologies and extensive interpretation of  multiple databases.   Critical Care Time devoted to patient care services described in this note is 35 Minutes.  Daniel Bensimhon,MD 8:54 AM

## 2014-03-07 NOTE — Discharge Summary (Signed)
DEATH SUMMARY  DATE OF ADMISSION:  6/14  DATE OF DISCHARGE/DEATH:  6/18  ADMISSION DIAGNOSES:   Acute respiratory failure Acute systolic congestive heart failure Severe cardiomyopathy Severe lactic acidosis Abdominal pain Nausea/vomiting Weight loss Metabolic acidosis Acute renal failure Elevated liver function tests Coagulopathy Anemia Thrombocytopenia HIV/AIDS Possible PNA Hyperthyroidism Acute encephalopathy   DISCHARGE DIAGNOSES:   Severe cardiomyopathy, unclear etiology  Cardiogenic shock refractory to vasopressors/inotropes  Acute respiratory failure  NSVT  S/P PEA/VF arrest 6/15  Severe lactic acidosis  Acute renal failure, anuric  Hypokalemia, resolved  Hypomagnesemia, resolved  Elevated bilirubin  Shock liver  Possible cholecystitis  Possible colitis  Coagulopathy, likely due to hepatic failure  Anemia without acute blood loss  Worsening thrombocytopenia  HIV/AIDS Possible cholecystitis  Possible colitis  Hyperthyroidism  Severe acute encephalopathy  PRESENTATION:   Pt was admitted with the following HPI and the above admission diagnoses:  HISTORY OF PRESENT ILLNESS: 43 yo HIV+ diagnosed in 2011. Unclear if on antiretroviral therapy. Presented to [facility] with nausea, vomiting and abdominal pain. Found to have elevated bilirubin. Today developed dyspnea, hypoxia, chest pain and bilateral pulmonary infiltrates. Noted to be severely acidotic with a lactate of 16. Intubated. Hypotensive post-intubation requiring vasopressors. Of note, she has 2 daughters but she is not married and does not have any other family. Her pastors present in the Hospital are the closest persons to her. 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 She has lost more than 30 pounds over the last two months. She has been complaining of severe back pain. Not eating well. Most recently she was noticed to be SOB with minimal exertion. [facility] she is  intubated, sedated, hypotensive and tachycardic. She has a right femoral CVC.   HOSPITAL COURSE:   SIGNIFICANT EVENTS / STUDIES:  6/14 Limited echocardiogram: severe global myocardial hypokinesis.  6/14 TSH < .008, free T4 1.82 (minimally elevated), free T3 91 (normal)  6/15 CTAP: Gallbladder wall edematous, colitis  6/15 Echocardiogram: LVEF 15%  6/15 PEA, then VF arrest. Prolonged ACLS (epi X 4, Ca2+, HCO3). Persistent shock requiring max dose vasopressors. Amiodarone and milrinone initiated. Emergent IABP placed. Transferred to CCU  6/16 minimally responsive, requiring max dose vasopressors, oligo-anuric with worsening renal function indices  6/17 Continued deterioration with progressive shock resistant to maximum dose vasopressors. Anuric. LFTs rising. Comatose  6/17 Lengthy discussions with pt's pastor and his wife who have been very close to pt. Also spoke with pt's cousin over speaker phone during the conference. Explained that therapies are "maxed out" and that ACLS/CPR would be futile. Pt not deemed to be candidate for CRRT due to severely unstable status. Plan for cousin to arrive from [place] and an uncle from [place]. Plan to meet again 6/18 AM with likely withdrawal of support unless there are signs of improvement  6/18 Family conference. Uncle, 2 cousins, pastor updated. It was conveyed that prognosis for survival was essentially nil. All agreed that proper course was discontinuation of life sustaining therapies and full comfort care. Withdrawal protocol implemented. Pt passed away shortly thereafter  LINES / TUBES:  ETT 6/14 >>  L [place] CVL 6/14 >>  R femoral A-line 6/15 >>  IABP, L femoral artery 6/15 >>   CULTURES:  Urine 6/14 >> E coli  Resp 6/14 >> mod strep pneumoniae  Blood 6/14 >>   ANTIBIOTICS:  Flagyl 6/14 >> 6/15  Zosyn 6/14 >> 6/18  Vancomycin 6/14 >> 6/18   Cause of death:  Refractory cardiogenic shock Severe cardiomyopathy  Contributing  factors: HIV/AIDS MODS  Autopsy: No  Smoking: No  Billy Fischer, MD;  PCCM service; Mobile [phone]

## 2014-03-07 NOTE — Progress Notes (Addendum)
ANTIBIOTIC CONSULT NOTE - FOLLOW UP  Pharmacy Consult for vanc/zosyn Indication: pneumonia  Allergies  Allergen Reactions  . Diphenhydramine Hcl Other (See Comments)    Per family, reaction unknown   . Latex Other (See Comments)    Per family, reaction unknown   . Sulfonamide Derivatives Other (See Comments)    Per family, reaction unknown     Patient Measurements: Height: 5\' 5"  (165.1 cm) Weight: 136 lb 7.4 oz (61.9 kg) IBW/kg (Calculated) : 57 Adjusted Body Weight:   Vital Signs: Temp: 97.6 F (36.4 C) (06/18 0700) Temp src: Oral (06/18 0700) BP: 105/52 mmHg (06/18 0600) Pulse Rate: 133 (06/18 0313) Intake/Output from previous day: 06/17 0701 - 06/18 0700 In: 4420.4 [I.V.:2505.4; NG/GT:265; IV Piggyback:1650] Out: 630 [Urine:30; Stool:600] Intake/Output from this shift:    Labs:  Recent Labs  02/20/14 0425 02/20/14 1730 02/21/14 0430 02/21/2014 0400  WBC 24.6*  --  23.4* 20.2*  HGB 11.4*  --  9.3* 8.2*  PLT 65*  --  47* 36*  CREATININE 1.67* 1.95* 2.31* 2.95*   Estimated Creatinine Clearance: 22.6 ml/min (by C-G formula based on Cr of 2.95).  Recent Labs  02/21/14 0915 02/08/2014 0400  VANCOTROUGH 31.1*  --   VANCORANDOM  --  28.4     Microbiology: Recent Results (from the past 720 hour(s))  CULTURE, RESPIRATORY (NON-EXPECTORATED)     Status: None   Collection Time    2014-02-19  7:43 PM      Result Value Ref Range Status   Specimen Description TRACHEAL ASPIRATE   Final   Special Requests NONE   Final   Gram Stain     Final   Value: FEW WBC PRESENT, PREDOMINANTLY PMN     FEW SQUAMOUS EPITHELIAL CELLS PRESENT     FEW GRAM POSITIVE COCCI IN PAIRS     RARE GRAM NEGATIVE RODS     Performed at [facility]   Culture     Final   Value: MODERATE STREPTOCOCCUS PNEUMONIAE     Performed at [facility]   Report Status PENDING   Incomplete  MRSA PCR SCREENING     Status: None   Collection Time    02/19/14  8:15 PM      Result Value Ref  Range Status   MRSA by PCR NEGATIVE  NEGATIVE Final   Comment:            The GeneXpert MRSA Assay (FDA     approved for NASAL specimens     only), is one component of a     comprehensive MRSA colonization     surveillance program. It is not     intended to diagnose MRSA     infection nor to guide or     monitor treatment for     MRSA infections.  URINE CULTURE     Status: None   Collection Time    February 19, 2014  8:42 PM      Result Value Ref Range Status   Specimen Description URINE, CATHETERIZED   Final   Special Requests NONE   Final   Culture  Setup Time     Final   Value: 02/19/2014 00:57     Performed at [facility] Count     Final   Value: 70,000 COLONIES/ML     Performed at [facility]   Culture     Final   Value: ESCHERICHIA COLI     Performed at [facility]   Report Status 02/20/2014 FINAL   Final   Organism ID, Bacteria ESCHERICHIA COLI   Final  CULTURE, BLOOD (ROUTINE X 2)     Status: None   Collection Time    02/10/2014  9:10 PM      Result Value Ref Range Status   Specimen Description BLOOD LEFT ARM   Final   Special Requests BOTTLES DRAWN AEROBIC AND ANAEROBIC 2.5CC EACH   Final   Culture  Setup Time     Final   Value: 02/19/2014 00:45     Performed at [facility]   Culture     Final   Value:        BLOOD CULTURE RECEIVED NO GROWTH TO DATE CULTURE WILL BE HELD FOR 5 DAYS BEFORE ISSUING A FINAL NEGATIVE REPORT     Performed at [facility]   Report Status PENDING   Incomplete  CULTURE, BLOOD (ROUTINE X 2)     Status: None   Collection Time    02/19/2014  9:25 PM      Result Value Ref Range Status   Specimen Description BLOOD LEFT ARM   Final   Special Requests BOTTLES DRAWN AEROBIC AND ANAEROBIC 2.5CC EACH   Final   Culture  Setup Time     Final   Value: 02/19/2014 00:45     Performed at [facility]   Culture     Final   Value:        BLOOD CULTURE RECEIVED NO GROWTH TO DATE CULTURE WILL BE HELD FOR  5 DAYS BEFORE ISSUING A FINAL NEGATIVE REPORT     Performed at [facility]   Report Status PENDING   Incomplete    Anti-infectives   Start     Dose/Rate Route Frequency Ordered Stop   02/21/14 1400  piperacillin-tazobactam (ZOSYN) IVPB 2.25 g     2.25 g 100 mL/hr over 30 Minutes Intravenous Every 6 hours 02/21/14 1240     02/20/14 2200  levofloxacin (LEVAQUIN) IVPB 750 mg  Status:  Discontinued     750 mg 100 mL/hr over 90 Minutes Intravenous Every 48 hours 02/09/2014 2142 02/19/14 0430   02/20/14 1000  vancomycin (VANCOCIN) IVPB 750 mg/150 ml premix  Status:  Discontinued     750 mg 150 mL/hr over 60 Minutes Intravenous Every 24 hours 02/20/14 0818 02/21/14 1237   02/19/14 1400  metroNIDAZOLE (FLAGYL) 50 mg/ml oral suspension 500 mg  Status:  Discontinued     500 mg Oral 3 times per day 02/19/14 1106 02/19/14 1311   02/19/14 1200  azithromycin (ZITHROMAX) 500 mg in dextrose 5 % 250 mL IVPB  Status:  Discontinued     500 mg 250 mL/hr over 60 Minutes Intravenous Every 24 hours 02/19/14 1110 02/19/14 1311   02/19/14 0900  vancomycin (VANCOCIN) 500 mg in sodium chloride 0.9 % 100 mL IVPB  Status:  Discontinued     500 mg 100 mL/hr over 60 Minutes Intravenous Every 12 hours 02/13/2014 2050 02/20/14 0818   02/19/14 0800  elvitegravir-cobicistat-emtricitabine-tenofovir (STRIBILD) 150-150-200-300 MG tablet 1 tablet  Status:  Discontinued     1 tablet Oral Daily with breakfast 02/28/2014 2026 02/20/2014 2038   02/19/14 0600  piperacillin-tazobactam (ZOSYN) IVPB 3.375 g  Status:  Discontinued     3.375 g 12.5 mL/hr over 240 Minutes Intravenous 3 times per day 02/25/2014 2050 02/21/14 1237   02/19/14 0445  metroNIDAZOLE (FLAGYL) 50 mg/ml oral suspension 250 mg  Status:  Discontinued  250 mg Oral 3 times per day 02/19/14 0430 02/19/14 1106   April 22, 2014 2200  dapsone tablet 100 mg  Status:  Discontinued     100 mg Per Tube Daily April 22, 2014 2026 April 22, 2014 2038   April 22, 2014 2100  vancomycin (VANCOCIN)  IVPB 750 mg/150 ml premix     750 mg 150 mL/hr over 60 Minutes Intravenous  Once April 22, 2014 2050 02/19/14 0044   April 22, 2014 2100  piperacillin-tazobactam (ZOSYN) IVPB 3.375 g     3.375 g 100 mL/hr over 30 Minutes Intravenous  Once April 22, 2014 2050 April 22, 2014 2234   April 22, 2014 2100  levofloxacin (LEVAQUIN) IVPB 750 mg  Status:  Discontinued     750 mg 100 mL/hr over 90 Minutes Intravenous Every 24 hours April 22, 2014 2050 April 22, 2014 2142      Assessment: 43 yo with advanced HIV who has been on vanc/zosyn to cover for PNA. She has developed renal failure. Random vanc level came back at 28 this AM. ID said vanc could be dced today if cultures are neg. She has grown out some strep pneumo in sputum and e.coli in urine.   Goal of Therapy:  Vancomycin trough level 15-20 mcg/ml  Plan:   Hold vanc Cont zosyn 2.25g IV q6   Addendum  Pt is on heparin for IABP pump. Level came back just slightly low. Her plt is down to 36k. H/H have also trended down also. Tough case. Will keep heparin at a very low goal.  Plan  Increase heparin to 1100 units/hr F/u with 8hr level  Ulyses SouthwardMinh Pham, PharmD Pager: [phone] 02/26/2014 12:36 PM

## 2014-03-07 NOTE — Progress Notes (Signed)
Patient terminally extubated per MD order. Positive for cuff leak. RN and family at bedside post extubation.

## 2014-03-07 NOTE — Progress Notes (Signed)
PULMONARY / CRITICAL CARE MEDICINE  Name: Michelle CurtisOdella P Flores MRN: [id number] DOB: 09/08/1970    ADMISSION DATE:  02/14/2014 CONSULTATION DATE:  02/17/2014  REFERRING MD :  EDP PRIMARY SERVICE:  PCCM  CHIEF COMPLAINT:  Acute respiratory failure  BRIEF PATIENT DESCRIPTION: 43 yo HIV+ (followed by Dr. Orvan Falconerampbell) presented to [facility] with nausea, vomiting and abdominal pain. Found to have elevated bilirubin. [facility] ED 6/14 with dyspnea, hypoxia, bilateral pulmonary infiltrates, severe acidosis, hypotension. Intubated. Hypotensive post-intubation requiring vasopressors.  SIGNIFICANT EVENTS / STUDIES:  6/14  Limited echocardiogram:  severe global myocardial hypokinesis.  6/14 TSH < .008, free T4 1.82 (minimally elevated), free T3 91 (normal) 6/15  CTAP: Gallbladder wall edematous, colitis 6/15  Echocardiogram:  LVEF 15% 6/15 PEA, then VF arrest. Prolonged ACLS (epi X 4, Ca2+, HCO3). Persistent shock requiring max dose vasopressors. Amiodarone and milrinone initiated. Emergent IABP placed. Transferred to CCU 6/16 minimally responsive, requiring max dose vasopressors, oligo-anuric with worsening renal function indices 6/17 Continued deterioration with progressive shock resistant to maximum dose vasopressors. Anuric. LFTs rising. Comatose 6/17 Lengthy discussions with pt's pastor and his wife who have been very close to pt. Also spoke with pt's cousin over speaker phone during the conference. Explained that therapies are "maxed out" and that ACLS/CPR would be futile. Pt not deemed to be candidate for CRRT due to severely unstable status. Plan for cousin to arrive from [place] and an uncle from [place]. Plane to meet again 618 AM with likely withdrawal of support unless there are signs of improvement  LINES / TUBES: ETT 6/14 >>  L [place] CVL 6/14 >>  R femoral A-line 6/15 >>  IABP, L femoral artery 6/15 >>   CULTURES: Urine 6/14 >> E coli Resp 6/14 >> mod strep pneumoniae Blood 6/14 >>    ANTIBIOTICS: Flagyl 6/14 >> 6/15 Zosyn 6/14 >> 6/18 Vancomycin 6/14 >> 6/18   INTERVAL HISTORY:   Little change other than some stabilization in BP. Remains anuric. Minimally responsive   VITAL SIGNS: Temp:  [97.6 F (36.4 C)-98.7 F (37.1 C)] 98 F (36.7 C) (06/18 1100) Pulse Rate:  [88-144] 120 (06/18 1226) Resp:  [18-24] 24 (06/18 1226) BP: (92-113)/(47-56) 101/51 mmHg (06/18 1100) SpO2:  [98 %-100 %] 100 % (06/18 1226) Arterial Line BP: (53-116)/(17-61) 93/48 mmHg (06/18 1200) FiO2 (%):  [30 %] 30 % (06/18 1226) Weight:  [61.9 kg (136 lb 7.4 oz)] 61.9 kg (136 lb 7.4 oz) (06/18 0500)  HEMODYNAMICS: CVP:  [9 mmHg-18 mmHg] 14 mmHg  VENTILATOR SETTINGS: Vent Mode:  [-] PRVC FiO2 (%):  [30 %] 30 % Set Rate:  [20 bmp] 20 bmp Vt Set:  [400 mL] 400 mL PEEP:  [5 cmH20] 5 cmH20 Plateau Pressure:  [14 cmH20-18 cmH20] 14 cmH20  INTAKE / OUTPUT: Intake/Output     06/17 0701 - 06/18 0700 06/18 0701 - 06/19 0700   I.V. (mL/kg) 2505.4 (40.5) 438.9 (7.1)   NG/GT 265 133   IV Piggyback 1650 50   Total Intake(mL/kg) 4420.4 (71.4) 621.9 (10)   Urine (mL/kg/hr) 30 (0)    Stool 600 (0.4)    Total Output 630     Net +3790.4 +621.9          PHYSICAL EXAMINATION: General:  Minimally responsive Neuro:  No spont movement, minimal withdrawal HEENT:  PERRL, EOMI, NCAT Cardiovascular:  Tachy, freq extrasystoles, HS obscured by IABP Lungs: clear anteriorly Abdomen:  Soft, nontender, bowel sounds diminished Ext: no edema, cool  LABS: I have  reviewed all of today's lab results. Relevant abnormalities are discussed in the A/P section  CXR: NSC.   ASSESSMENT / PLAN: Severe cardiomyopathy, unclear etiology Cardiogenic shock refractory to vasopressors/inotropes Acute respiratory failure NSVT  S/P PEA/VF arrest 6/15 Severe lactic acidosis Acute renal failure, anuric Hypokalemia, resolved Hypomagnesemia, resolved Elevated bilirubin Shock liver Possible  cholecystitis Possible colitis Coagulopathy, likely due to hepatic failure Anemia without acute blood loss Worsening thrombocytopenia HIV+ (CD4 11) Possible cholecystitis Possible colitis Doubt pneumonia Hyperthyroidism Severe acute encephalopathy    Rest of loved ones here including Michelle Flores, cousins, uncle. Daughters have been in to see her. All are accepting of the very poor prognosis for survival. All recognize that we are potentially prolonging her suffering with nothing favorable to come of our efforts. All are in agreement that proper course of action is compassionate withdrawal of support and full comfort care. Withdrawal protocol implemented  40 mins  Billy Fischer, MD ; [facility] [phone].  After 5:30 PM or weekends, call [phone]   02/20/2014, 1:12 PM

## 2014-03-07 DEATH — deceased

## 2015-07-26 IMAGING — CR DG CHEST 1V PORT
1 series · 1 of 1 positions shown · non-contrast
Comparison: 02/18/2014 at 0383 hr.

CLINICAL DATA: Endotracheal tube placement. Postprocedural
radiograph.

EXAM:
PORTABLE CHEST - 1 VIEW

[AP]
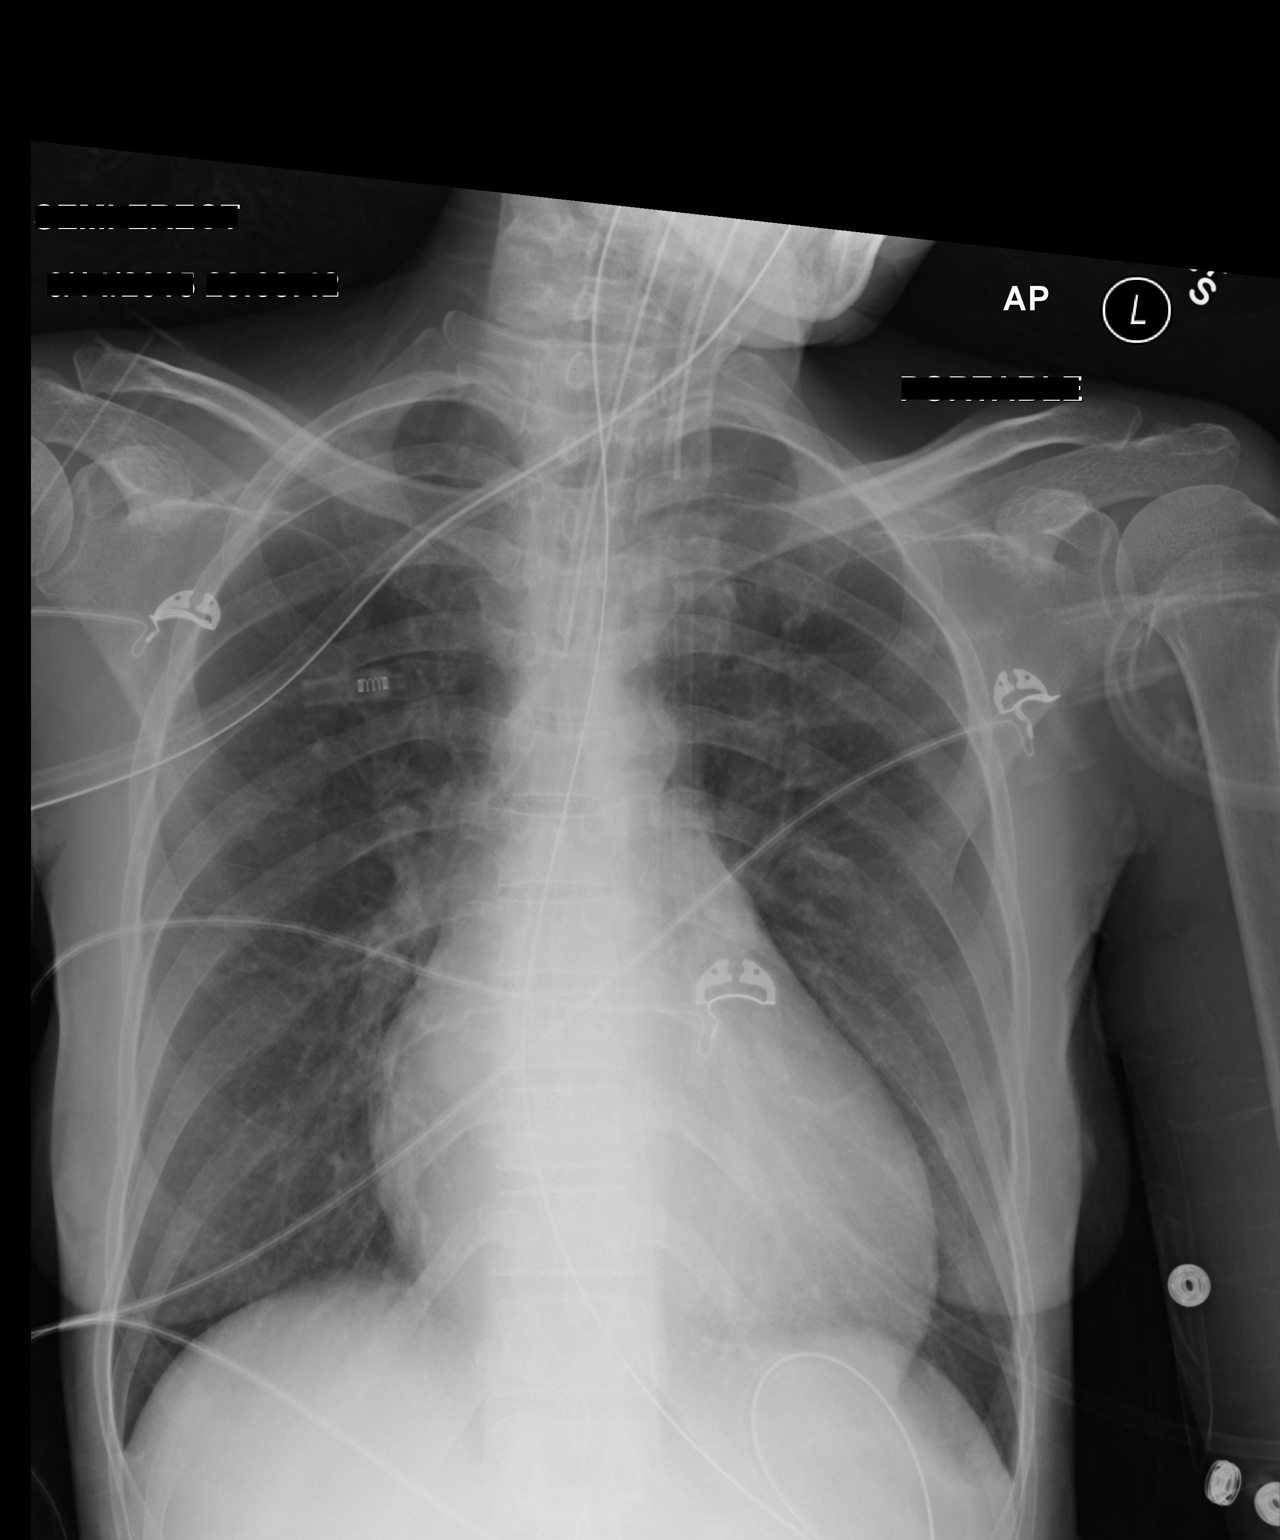

[1 of 1 positions shown; findings below may reference images not displayed]

FINDINGS: The endotracheal tube is unchanged. Interval introduction of an
enteric tube with the tip not visualized. Redundant loop in the
gastric fundus. Cardiomegaly remains present. Monitoring leads
project over the chest. No airspace disease. No effusion.
IMPRESSION: Interval placement of enteric tube. Endotracheal tube unchanged.
Unchanged cardiomegaly.

## 2015-07-28 IMAGING — CR DG CHEST 1V PORT
1 series · 1 of 1 positions shown · non-contrast
Comparison: February 19, 2014.

CLINICAL DATA: Status post cardiac arrest.

EXAM:
PORTABLE CHEST - 1 VIEW

[AP]
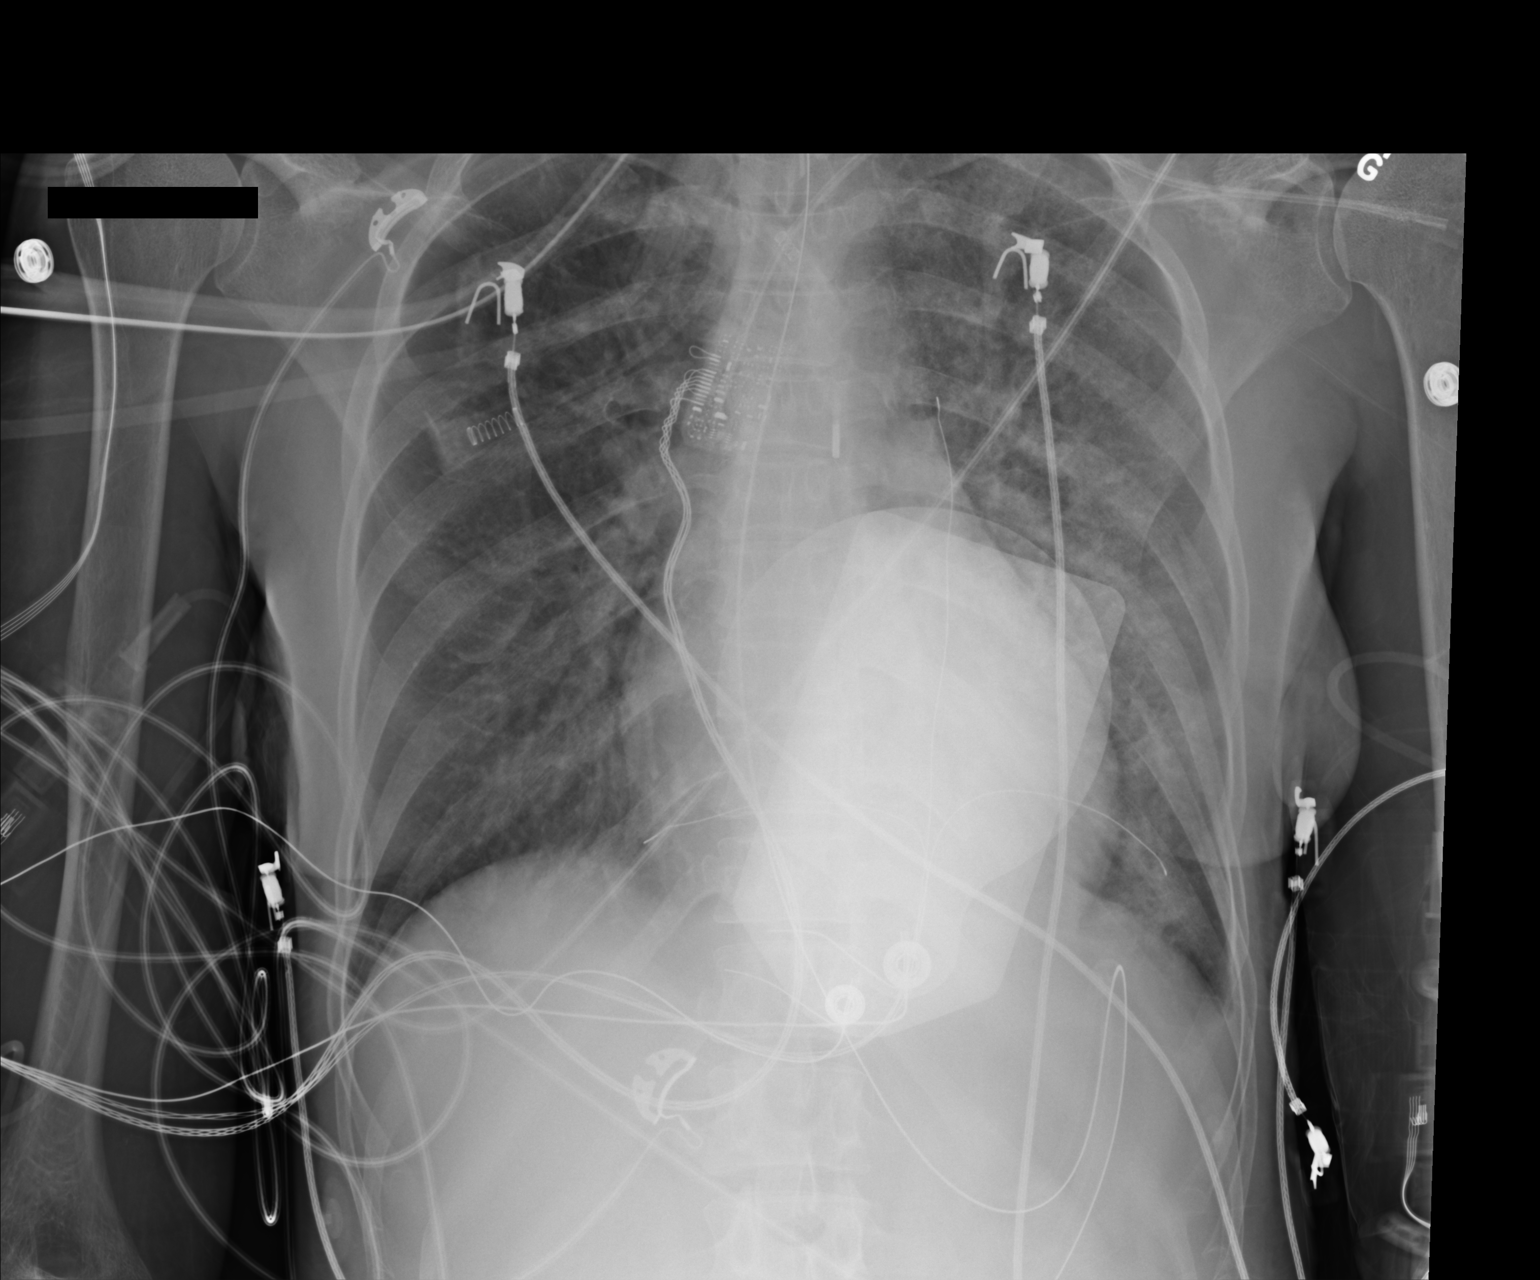

[1 of 1 positions shown; findings below may reference images not displayed]

FINDINGS: Cardiomediastinal silhouette appears normal. Endotracheal tube is in
good position with distal tip 6 cm above the carina. Nasogastric
tube is seen in proximal stomach. Defibrillator pad is seen over the
heart. No pneumothorax or significant pleural effusion is noted.
Left subclavian catheter tip is seen in expected position of the
SVC. Minimal interstitial densities are noted in both lungs, with
left worse than right, concerning for possible edema. Bony thorax
appears intact. Linear metallic density is seen to the left of the
thoracic spine which may represent aortic balloon catheter tip or
other foreign body.
IMPRESSION: Stable support apparatus. Minimal interstitial densities seen in
both lungs concerning for possible pulmonary edema. Linear metallic
density seen to the left of the thoracic spine which may represent
aortic balloon catheter tip, but some form of foreign body cannot be
excluded if patient does not have such a catheter.

## 2015-07-30 IMAGING — CR DG CHEST 1V PORT
1 series · 1 of 1 positions shown · non-contrast
Comparison: 02/21/2014.

CLINICAL DATA: Intubation .

EXAM:
PORTABLE CHEST - 1 VIEW

[AP]
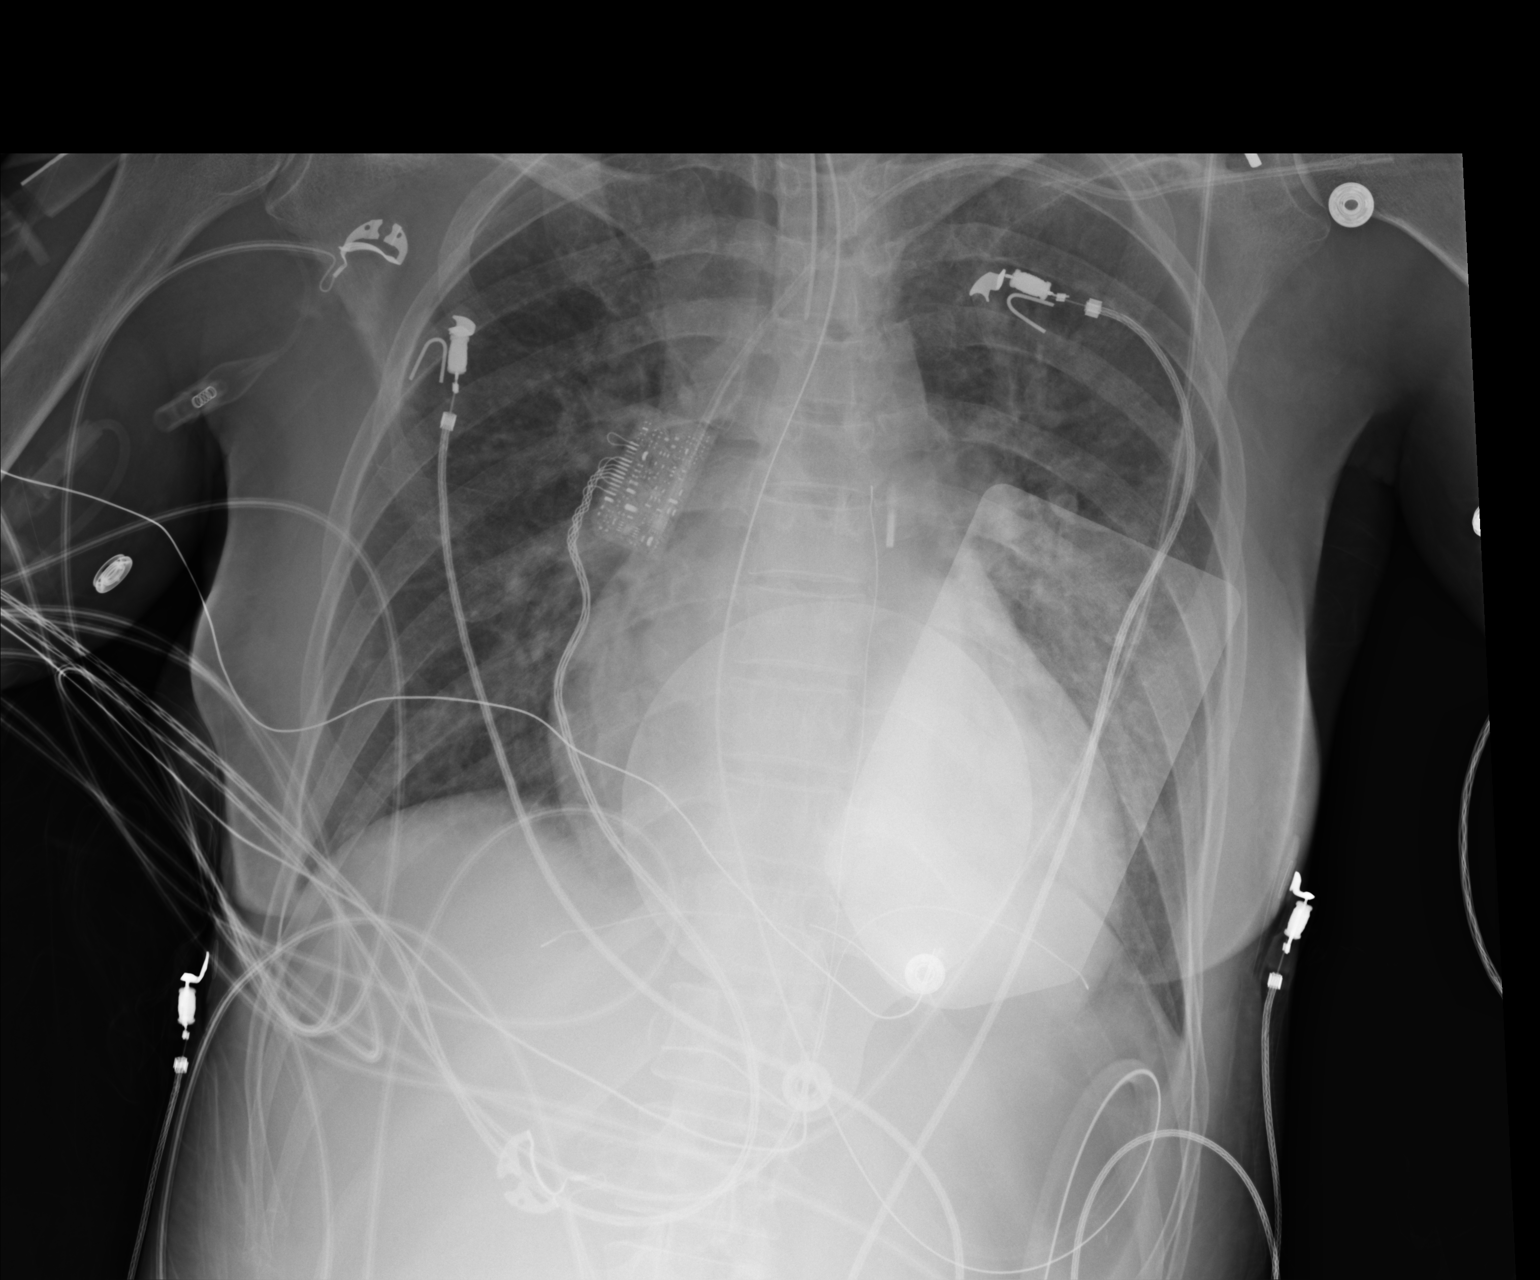

[1 of 1 positions shown; findings below may reference images not displayed]

FINDINGS: Endotracheal tube, NG tube, left subclavian central line,
intra-aortic balloon pump in stable position. Multiple overlying
monitoring devices again noted. Persistent cardiomegaly and mild
pulmonary venous congestion. Mild component of pulmonary
interstitial edema cannot be excluded. Mild atelectasis left lung
base. No significant pleural effusion or pneumothorax. No acute bony
abnormality.
IMPRESSION: 1. Lines and tubes in stable position.
2. Cardiomegaly with persistent mild pulmonary venous congestion and
mild interstitial edema . No evidence of progressive congestive
heart failure.
